# Patient Record
Sex: Male | Born: 1990 | Race: White | Hispanic: No | Marital: Single | Smoking: Never smoker
Health system: Southern US, Community
[De-identification: ages and names within clinical notes are randomized; demographics above are authoritative.]

## PROBLEM LIST (undated history)

## (undated) DIAGNOSIS — E109 Type 1 diabetes mellitus without complications: Secondary | ICD-10-CM

## (undated) DIAGNOSIS — K219 Gastro-esophageal reflux disease without esophagitis: Secondary | ICD-10-CM

## (undated) DIAGNOSIS — R569 Unspecified convulsions: Secondary | ICD-10-CM

## (undated) DIAGNOSIS — I10 Essential (primary) hypertension: Secondary | ICD-10-CM

## (undated) DIAGNOSIS — T148XXA Other injury of unspecified body region, initial encounter: Secondary | ICD-10-CM

## (undated) DIAGNOSIS — S42209A Unspecified fracture of upper end of unspecified humerus, initial encounter for closed fracture: Secondary | ICD-10-CM

## (undated) DIAGNOSIS — E7800 Pure hypercholesterolemia, unspecified: Secondary | ICD-10-CM

## (undated) HISTORY — DX: Pure hypercholesterolemia, unspecified: E78.00

## (undated) HISTORY — DX: Essential (primary) hypertension: I10

## (undated) HISTORY — PX: WISDOM TOOTH EXTRACTION: SHX21

---

## 2012-09-13 ENCOUNTER — Emergency Department (HOSPITAL_COMMUNITY): Payer: BC Managed Care – PPO

## 2012-09-13 ENCOUNTER — Emergency Department (HOSPITAL_COMMUNITY)
Admission: EM | Admit: 2012-09-13 | Discharge: 2012-09-13 | Disposition: A | Discharge status: Home / Self Care | Payer: BC Managed Care – PPO | Attending: Emergency Medicine | Admitting: Emergency Medicine

## 2012-09-13 DIAGNOSIS — E162 Hypoglycemia, unspecified: Secondary | ICD-10-CM

## 2012-09-13 DIAGNOSIS — E1069 Type 1 diabetes mellitus with other specified complication: Secondary | ICD-10-CM | POA: Insufficient documentation

## 2012-09-13 DIAGNOSIS — S42309A Unspecified fracture of shaft of humerus, unspecified arm, initial encounter for closed fracture: Secondary | ICD-10-CM | POA: Insufficient documentation

## 2012-09-13 DIAGNOSIS — Y929 Unspecified place or not applicable: Secondary | ICD-10-CM | POA: Insufficient documentation

## 2012-09-13 DIAGNOSIS — R230 Cyanosis: Secondary | ICD-10-CM | POA: Insufficient documentation

## 2012-09-13 DIAGNOSIS — S42209A Unspecified fracture of upper end of unspecified humerus, initial encounter for closed fracture: Secondary | ICD-10-CM

## 2012-09-13 DIAGNOSIS — R569 Unspecified convulsions: Secondary | ICD-10-CM | POA: Insufficient documentation

## 2012-09-13 DIAGNOSIS — Y939 Activity, unspecified: Secondary | ICD-10-CM | POA: Insufficient documentation

## 2012-09-13 DIAGNOSIS — X58XXXA Exposure to other specified factors, initial encounter: Secondary | ICD-10-CM | POA: Insufficient documentation

## 2012-09-13 HISTORY — DX: Unspecified fracture of upper end of unspecified humerus, initial encounter for closed fracture: S42.209A

## 2012-09-13 LAB — URINALYSIS, ROUTINE W REFLEX MICROSCOPIC
Nitrite: NEGATIVE
Specific Gravity, Urine: 1.018 (ref 1.005–1.030)
Urobilinogen, UA: 0.2 mg/dL (ref 0.0–1.0)
pH: 7.5 (ref 5.0–8.0)

## 2012-09-13 LAB — CBC
Platelets: 376 10*3/uL (ref 150–400)
RBC: 4.67 MIL/uL (ref 4.22–5.81)
WBC: 11.8 10*3/uL — ABNORMAL HIGH (ref 4.0–10.5)

## 2012-09-13 LAB — BASIC METABOLIC PANEL
CO2: 24 mEq/L (ref 19–32)
Chloride: 100 mEq/L (ref 96–112)
Potassium: 4.1 mEq/L (ref 3.5–5.1)
Sodium: 139 mEq/L (ref 135–145)

## 2012-09-13 LAB — GLUCOSE, CAPILLARY: Glucose-Capillary: 114 mg/dL — ABNORMAL HIGH (ref 70–99)

## 2012-09-13 LAB — URINE MICROSCOPIC-ADD ON

## 2012-09-13 MED ORDER — PERCOCET 5-325 MG PO TABS: 1.0000 | ORAL_TABLET | Freq: Four times a day (QID) | ORAL | Status: DC | PRN

## 2012-09-13 MED ORDER — LORAZEPAM 1 MG PO TABS
1.0000 mg | ORAL_TABLET | Freq: Once | ORAL | Status: AC
  Administered 2012-09-13: 1 mg via ORAL
  Filled 2012-09-13: qty 1

## 2012-09-13 MED ORDER — OXYCODONE-ACETAMINOPHEN 5-325 MG PO TABS
1.0000 | ORAL_TABLET | Freq: Once | ORAL | Status: AC
  Administered 2012-09-13: 1 via ORAL
  Filled 2012-09-13 (×2): qty 1

## 2012-09-13 MED ORDER — HYDROCODONE-ACETAMINOPHEN 5-325 MG PO TABS
1.0000 | ORAL_TABLET | Freq: Once | ORAL | Status: AC
  Administered 2012-09-13: 1 via ORAL
  Filled 2012-09-13: qty 1

## 2012-09-13 MED ORDER — HYDROCODONE-ACETAMINOPHEN 5-325 MG PO TABS: 1.0000 | ORAL_TABLET | Freq: Four times a day (QID) | ORAL | Status: DC | PRN

## 2012-09-13 NOTE — ED Provider Notes (Signed)
Medical screening examination/treatment/procedure(s) were performed by non-physician practitioner and as supervising physician I was immediately available for consultation/collaboration.  Gerhard Munch, MD 09/13/12 [phone]

## 2012-09-13 NOTE — ED Notes (Signed)
Patient has given a urinal

## 2012-09-13 NOTE — ED Provider Notes (Addendum)
History     CSN: [id number]  Arrival date & time 09/13/12  1148   First MD Initiated Contact with Patient 09/13/12 1225      Chief Complaint  Patient presents with  . Seizures    (Consider location/radiation/quality/duration/timing/severity/associated sxs/prior treatment) HPI Comments: 21 year old male with a history of type 1 diabetes (no blood insulin pump controlled) resents emergency department with a complaint of hypoglycemic seizure.  Patient states he's had an episode similar to this previously when he was 21 years old.  Episode was witnessed and lasted approximately 30 2/122.  Predominantly left sided shaking, loss of consciousness and cyanosis was present.  No bladder incontinence.  Patient reports that he went out drinking last night and did not eat this morning.  He was at breakfast and felt what his typical symptoms are for hypoglycemia ordering a pancake and orange juice.  Justice patient finished drinking the orange juices when he had the seizure.  He denies current headache, change in vision, ataxia, disequilibrium, head trauma, recent illness, fever, night sweats or chills.  Associated symptoms include muscle fatigue from seizure episode & right shoulder pain.  No other complaints at this time.  The history is provided by the patient.    No past medical history on file.  No past surgical history on file.  No family history on file.  History  Substance Use Topics  . Smoking status: Not on file  . Smokeless tobacco: Not on file  . Alcohol Use: Not on file      Review of Systems  Constitutional: Negative for fever, chills and appetite change.  HENT: Negative for congestion.   Eyes: Negative for visual disturbance.  Respiratory: Negative for shortness of breath.   Cardiovascular: Negative for chest pain and leg swelling.  Gastrointestinal: Negative for abdominal pain.  Genitourinary: Negative for dysuria, urgency and frequency.  Musculoskeletal: Positive for  myalgias.  Neurological: Positive for seizures. Negative for dizziness, syncope, weakness, light-headedness, numbness and headaches.  Psychiatric/Behavioral: Negative for confusion.    Allergies  Review of patient's allergies indicates no known allergies.  Home Medications   Current Outpatient Rx  Name Route Sig Dispense Refill  . IBUPROFEN 200 MG PO TABS Oral Take 200 mg by mouth every 6 (six) hours as needed. pain    . INSULIN ASPART 100 UNIT/ML [place] SOLN Subcutaneous Inject into the skin 3 (three) times daily before meals. Per sliding scale    . LISINOPRIL 5 MG PO TABS Oral Take 5 mg by mouth daily.      BP 154/89  Pulse 117  Temp 97.6 F (36.4 C) (Oral)  Resp 18  SpO2 97%  Physical Exam  Nursing note and vitals reviewed. Constitutional: He appears well-developed and well-nourished. No distress.       Post ictal, mildly altered, Hypertensive   HENT:  Head: Normocephalic.       Atraumatic, No evidence of tongue oral lacerations  Eyes: EOM are normal. Pupils are equal, round, and reactive to light.  Neck: Normal range of motion. Neck supple.       Cervical spinous process non tender without step offs, no difficulty or pain with flexion or extension of neck  Cardiovascular: Normal rate, regular rhythm, normal heart sounds and intact distal pulses.   Pulmonary/Chest: Breath sounds normal. No respiratory distress. He has no wheezes. He has no rales.  Abdominal: Soft. There is no tenderness.  Musculoskeletal: He exhibits tenderness. He exhibits no edema.       TTP over right  upper extremity, inability to perform R shoulder ROM d/t pain.  All other extremities w full active & passive ROM.  Neurological:       CN III-VII intact. Iintact coordination, sensation, and motor (finger grip, biceps, hamstrings & dorsiflexion). No pass pointing, good rapid coordination. Gait normal.   Skin: Skin is warm and dry. He is not diaphoretic.       intact    ED Course  Procedures (including  critical care time)  Labs Reviewed  CBC - Abnormal; Notable for the following:    WBC 11.8 (*)     All other components within normal limits  GLUCOSE, CAPILLARY - Abnormal; Notable for the following:    Glucose-Capillary 114 (*)     All other components within normal limits  URINALYSIS, ROUTINE W REFLEX MICROSCOPIC  BASIC METABOLIC PANEL   No results found.   No diagnosis found.  Consult: Dr. Luiz Blare orthopedics: f-u in his office first thing tomorrow morning (8:30 AM), place in sling and order CT  MDM  Hypoglycemic seizure, Humerus fracture  21 yo T1DM to ER after hypoglycemic seizure that occurred this morning after a night of drinking.  Patient with no evidence of focal neuro deficits on physical exam and is at mental baseline.  Labs and imaging have been reviewed. Humerus fracture seen & ortho consulted as above. Pt advised not to drive until BG is better managed and cleared by PCP. Answered all questions.  Patient is hemodynamically stable and in no acute distress prior to discharge.        Jaci Carrel, [place] 09/13/12 1958

## 2012-09-13 NOTE — ED Notes (Signed)
Pt has family at the bedside. He is A&O x4. Denies any blurred vision, headaches, discomforts. States his arms are a little sore. Will continue to closely monitor. Seizure pads are in place. [place] available.

## 2012-09-13 NOTE — ED Notes (Signed)
ZOX:[id number]<AV> Expected date:09/13/12<BR> Expected time:<BR> Means of arrival:<BR> Comments:<BR> EMS

## 2012-09-13 NOTE — ED Notes (Signed)
Pt brought in via EMS. EMS reports pt was eating breakfast and had a blank stare. He then begin having seizure activity which was witnessed by relatives. Lasted aprox 2-3 min. Pt's eyes rolled, unresponsive and his arms were clinched to his chest. Upon EMS arrival, pt was A&O, slightly lethargic, and states he "sort of felt hypoglycemic activity coming on but didn't drink orange juice" which may have  lead up to the seizure activity. Pt states "he did not check his blood sugar this morning. He wears a insulin pump with a basal rate going at 1.5u/hr. Pt is denying any blurred vision, headaches, discomforts. States his arms are sore however.

## 2012-09-13 NOTE — ED Notes (Signed)
Pt resting comfortably with family at the bedside. States "he is a little more relaxed and his arms do not hurt as much. He states they feel like they are cramping more than anything". Will continue to monitor closely. Denies any discomfort at this time. Pt is aware that a urine spec. Is needed and he is to call if he feels the need to void. Urinal is however at the bedside but pt verbalizes understanding of not getting up. Seizure pads remain in place. Pt is smiling and states "he is comfortable"

## 2012-09-13 NOTE — ED Notes (Addendum)
Pr has completed his shoulder CT. Pain has greatly improved. Parents at the bedside. R shoulder/arm sling is in place and pt verbalizes understanding of how to care for arm and the use of sling.

## 2012-09-14 ENCOUNTER — Other Ambulatory Visit: Payer: Self-pay | Admitting: Orthopedic Surgery

## 2012-09-14 ENCOUNTER — Encounter (HOSPITAL_BASED_OUTPATIENT_CLINIC_OR_DEPARTMENT_OTHER): Payer: Self-pay | Admitting: *Deleted

## 2012-09-14 DIAGNOSIS — T148XXA Other injury of unspecified body region, initial encounter: Secondary | ICD-10-CM

## 2012-09-14 HISTORY — DX: Other injury of unspecified body region, initial encounter: T14.8XXA

## 2012-09-14 NOTE — ED Provider Notes (Signed)
Medical screening examination/treatment/procedure(s) were performed by non-physician practitioner and as supervising physician I was immediately available for consultation/collaboration.   Crawford Tamura, MD 09/14/12 0808 

## 2012-09-15 ENCOUNTER — Ambulatory Visit (HOSPITAL_COMMUNITY): Payer: BC Managed Care – PPO

## 2012-09-15 ENCOUNTER — Ambulatory Visit (HOSPITAL_BASED_OUTPATIENT_CLINIC_OR_DEPARTMENT_OTHER)
Admission: RE | Admit: 2012-09-15 | Discharge: 2012-09-16 | Disposition: A | Discharge status: Home / Self Care | Payer: BC Managed Care – PPO | Source: Ambulatory Visit | Attending: Orthopedic Surgery | Admitting: Orthopedic Surgery

## 2012-09-15 ENCOUNTER — Encounter (HOSPITAL_BASED_OUTPATIENT_CLINIC_OR_DEPARTMENT_OTHER)
Admission: RE | Disposition: A | Discharge status: Home / Self Care | Payer: Self-pay | Source: Ambulatory Visit | Attending: Orthopedic Surgery

## 2012-09-15 ENCOUNTER — Encounter (HOSPITAL_BASED_OUTPATIENT_CLINIC_OR_DEPARTMENT_OTHER): Payer: Self-pay | Admitting: Anesthesiology

## 2012-09-15 ENCOUNTER — Encounter (HOSPITAL_BASED_OUTPATIENT_CLINIC_OR_DEPARTMENT_OTHER): Payer: Self-pay | Admitting: Certified Registered Nurse Anesthetist

## 2012-09-15 ENCOUNTER — Encounter (HOSPITAL_BASED_OUTPATIENT_CLINIC_OR_DEPARTMENT_OTHER): Payer: Self-pay | Admitting: *Deleted

## 2012-09-15 ENCOUNTER — Ambulatory Visit (HOSPITAL_BASED_OUTPATIENT_CLINIC_OR_DEPARTMENT_OTHER): Payer: BC Managed Care – PPO | Admitting: Anesthesiology

## 2012-09-15 DIAGNOSIS — Z9641 Presence of insulin pump (external) (internal): Secondary | ICD-10-CM | POA: Insufficient documentation

## 2012-09-15 DIAGNOSIS — S42213A Unspecified displaced fracture of surgical neck of unspecified humerus, initial encounter for closed fracture: Secondary | ICD-10-CM | POA: Insufficient documentation

## 2012-09-15 DIAGNOSIS — S42209A Unspecified fracture of upper end of unspecified humerus, initial encounter for closed fracture: Secondary | ICD-10-CM

## 2012-09-15 DIAGNOSIS — X500XXA Overexertion from strenuous movement or load, initial encounter: Secondary | ICD-10-CM | POA: Insufficient documentation

## 2012-09-15 DIAGNOSIS — S42293A Other displaced fracture of upper end of unspecified humerus, initial encounter for closed fracture: Secondary | ICD-10-CM | POA: Insufficient documentation

## 2012-09-15 DIAGNOSIS — R569 Unspecified convulsions: Secondary | ICD-10-CM | POA: Insufficient documentation

## 2012-09-15 DIAGNOSIS — E109 Type 1 diabetes mellitus without complications: Secondary | ICD-10-CM

## 2012-09-15 HISTORY — PX: ORIF HUMERUS FRACTURE: SHX2126

## 2012-09-15 HISTORY — DX: Unspecified convulsions: R56.9

## 2012-09-15 HISTORY — DX: Type 1 diabetes mellitus without complications: E10.9

## 2012-09-15 HISTORY — DX: Unspecified fracture of upper end of unspecified humerus, initial encounter for closed fracture: S42.209A

## 2012-09-15 HISTORY — DX: Other injury of unspecified body region, initial encounter: T14.8XXA

## 2012-09-15 HISTORY — DX: Gastro-esophageal reflux disease without esophagitis: K21.9

## 2012-09-15 LAB — POCT I-STAT, CHEM 8
Calcium, Ion: 1.09 mmol/L — ABNORMAL LOW (ref 1.12–1.23)
Glucose, Bld: 201 mg/dL — ABNORMAL HIGH (ref 70–99)
HCT: 39 % (ref 39.0–52.0)
TCO2: 22 mmol/L (ref 0–100)

## 2012-09-15 LAB — GLUCOSE, CAPILLARY: Glucose-Capillary: 220 mg/dL — ABNORMAL HIGH (ref 70–99)

## 2012-09-15 SURGERY — OPEN REDUCTION INTERNAL FIXATION (ORIF) PROXIMAL HUMERUS FRACTURE
Anesthesia: General | Site: Shoulder | Laterality: Right | Wound class: Clean

## 2012-09-15 MED ORDER — POTASSIUM CHLORIDE IN NACL 20-0.45 MEQ/L-% IV SOLN: INTRAVENOUS | Status: DC

## 2012-09-15 MED ORDER — MIDAZOLAM HCL 5 MG/5ML IJ SOLN
INTRAMUSCULAR | Status: DC | PRN
  Administered 2012-09-15: 2 mg via INTRAVENOUS

## 2012-09-15 MED ORDER — LACTATED RINGERS IV SOLN
INTRAVENOUS | Status: DC
  Administered 2012-09-15 (×2): via INTRAVENOUS

## 2012-09-15 MED ORDER — CEFAZOLIN SODIUM-DEXTROSE 2-3 GM-% IV SOLR: 2.0000 g | INTRAVENOUS | Status: DC

## 2012-09-15 MED ORDER — OXYCODONE HCL 5 MG PO TABS: 5.0000 mg | ORAL_TABLET | Freq: Once | ORAL | Status: DC | PRN

## 2012-09-15 MED ORDER — ONDANSETRON HCL 4 MG/2ML IJ SOLN: 4.0000 mg | Freq: Four times a day (QID) | INTRAMUSCULAR | Status: DC | PRN

## 2012-09-15 MED ORDER — LISINOPRIL 5 MG PO TABS: 5.0000 mg | ORAL_TABLET | Freq: Every day | ORAL | Status: DC

## 2012-09-15 MED ORDER — ONDANSETRON HCL 4 MG PO TABS: 4.0000 mg | ORAL_TABLET | Freq: Four times a day (QID) | ORAL | Status: DC | PRN

## 2012-09-15 MED ORDER — OXYCODONE HCL 5 MG/5ML PO SOLN: 5.0000 mg | Freq: Once | ORAL | Status: DC | PRN

## 2012-09-15 MED ORDER — HYDROMORPHONE HCL PF 1 MG/ML IJ SOLN: 0.2500 mg | INTRAMUSCULAR | Status: DC | PRN

## 2012-09-15 MED ORDER — CEFAZOLIN SODIUM-DEXTROSE 2-3 GM-% IV SOLR
2.0000 g | Freq: Four times a day (QID) | INTRAVENOUS | Status: AC
  Administered 2012-09-15 – 2012-09-16 (×3): 2 g via INTRAVENOUS

## 2012-09-15 MED ORDER — ACETAMINOPHEN 650 MG RE SUPP: 650.0000 mg | Freq: Four times a day (QID) | RECTAL | Status: DC | PRN

## 2012-09-15 MED ORDER — 0.9 % SODIUM CHLORIDE (POUR BTL) OPTIME
TOPICAL | Status: DC | PRN
  Administered 2012-09-15: 500 mL

## 2012-09-15 MED ORDER — POVIDONE-IODINE 7.5 % EX SOLN: Freq: Once | CUTANEOUS | Status: DC

## 2012-09-15 MED ORDER — PROPOFOL 10 MG/ML IV BOLUS
INTRAVENOUS | Status: DC | PRN
  Administered 2012-09-15: 200 mg via INTRAVENOUS

## 2012-09-15 MED ORDER — HYDROCODONE-ACETAMINOPHEN 5-325 MG PO TABS
1.0000 | ORAL_TABLET | ORAL | Status: DC | PRN
  Administered 2012-09-15 – 2012-09-16 (×4): 2 via ORAL

## 2012-09-15 MED ORDER — FENTANYL CITRATE 0.05 MG/ML IJ SOLN
INTRAMUSCULAR | Status: DC | PRN
  Administered 2012-09-15: 25 ug via INTRAVENOUS
  Administered 2012-09-15: 50 ug via INTRAVENOUS

## 2012-09-15 MED ORDER — POTASSIUM CHLORIDE IN NACL 20-0.9 MEQ/L-% IV SOLN
INTRAVENOUS | Status: DC
  Administered 2012-09-15: 100 mL/h via INTRAVENOUS

## 2012-09-15 MED ORDER — SUCCINYLCHOLINE CHLORIDE 20 MG/ML IJ SOLN
INTRAMUSCULAR | Status: DC | PRN
  Administered 2012-09-15: 100 mg via INTRAVENOUS

## 2012-09-15 MED ORDER — GLYCOPYRROLATE 0.2 MG/ML IJ SOLN
INTRAMUSCULAR | Status: DC | PRN
  Administered 2012-09-15: .5 mg via INTRAVENOUS

## 2012-09-15 MED ORDER — ALUM & MAG HYDROXIDE-SIMETH 200-200-20 MG/5ML PO SUSP: 30.0000 mL | ORAL | Status: DC | PRN

## 2012-09-15 MED ORDER — FENTANYL CITRATE 0.05 MG/ML IJ SOLN
50.0000 ug | INTRAMUSCULAR | Status: DC | PRN
  Administered 2012-09-15: 100 ug via INTRAVENOUS

## 2012-09-15 MED ORDER — METOCLOPRAMIDE HCL 5 MG PO TABS: 5.0000 mg | ORAL_TABLET | Freq: Three times a day (TID) | ORAL | Status: DC | PRN

## 2012-09-15 MED ORDER — ONDANSETRON HCL 4 MG/2ML IJ SOLN
INTRAMUSCULAR | Status: DC | PRN
  Administered 2012-09-15: 4 mg via INTRAVENOUS

## 2012-09-15 MED ORDER — DOCUSATE SODIUM 100 MG PO CAPS: 100.0000 mg | ORAL_CAPSULE | Freq: Two times a day (BID) | ORAL | Status: DC

## 2012-09-15 MED ORDER — OXYCODONE-ACETAMINOPHEN 5-325 MG PO TABS: 1.0000 | ORAL_TABLET | ORAL | Status: DC | PRN

## 2012-09-15 MED ORDER — METOCLOPRAMIDE HCL 5 MG/ML IJ SOLN: 5.0000 mg | Freq: Three times a day (TID) | INTRAMUSCULAR | Status: DC | PRN

## 2012-09-15 MED ORDER — ROCURONIUM BROMIDE 100 MG/10ML IV SOLN
INTRAVENOUS | Status: DC | PRN
  Administered 2012-09-15: 25 mg via INTRAVENOUS

## 2012-09-15 MED ORDER — LIDOCAINE HCL (CARDIAC) 20 MG/ML IV SOLN
INTRAVENOUS | Status: DC | PRN
  Administered 2012-09-15: 50 mg via INTRAVENOUS

## 2012-09-15 MED ORDER — ONDANSETRON HCL 4 MG/2ML IJ SOLN: 4.0000 mg | Freq: Once | INTRAMUSCULAR | Status: DC | PRN

## 2012-09-15 MED ORDER — ACETAMINOPHEN 325 MG PO TABS: 650.0000 mg | ORAL_TABLET | Freq: Four times a day (QID) | ORAL | Status: DC | PRN

## 2012-09-15 MED ORDER — ZOLPIDEM TARTRATE 5 MG PO TABS: 5.0000 mg | ORAL_TABLET | Freq: Every evening | ORAL | Status: DC | PRN

## 2012-09-15 MED ORDER — CEFAZOLIN SODIUM-DEXTROSE 2-3 GM-% IV SOLR
INTRAVENOUS | Status: DC | PRN
  Administered 2012-09-15: 2 g via INTRAVENOUS

## 2012-09-15 MED ORDER — NEOSTIGMINE METHYLSULFATE 1 MG/ML IJ SOLN
INTRAMUSCULAR | Status: DC | PRN
  Administered 2012-09-15: 3.5 mg via INTRAVENOUS

## 2012-09-15 MED ORDER — MIDAZOLAM HCL 2 MG/2ML IJ SOLN
1.0000 mg | INTRAMUSCULAR | Status: DC | PRN
  Administered 2012-09-15: 2 mg via INTRAVENOUS

## 2012-09-15 MED ORDER — POLYETHYLENE GLYCOL 3350 17 G PO PACK: 17.0000 g | PACK | Freq: Every day | ORAL | Status: DC | PRN

## 2012-09-15 MED ORDER — DIPHENHYDRAMINE HCL 12.5 MG/5ML PO ELIX: 12.5000 mg | ORAL_SOLUTION | ORAL | Status: DC | PRN

## 2012-09-15 MED ORDER — BISACODYL 5 MG PO TBEC: 5.0000 mg | DELAYED_RELEASE_TABLET | Freq: Every day | ORAL | Status: DC | PRN

## 2012-09-15 MED ORDER — MORPHINE SULFATE 2 MG/ML IJ SOLN: 1.0000 mg | INTRAMUSCULAR | Status: DC | PRN

## 2012-09-15 MED ORDER — FLEET ENEMA 7-19 GM/118ML RE ENEM: 1.0000 | ENEMA | Freq: Once | RECTAL | Status: AC | PRN

## 2012-09-15 MED ORDER — MENTHOL 3 MG MT LOZG: 1.0000 | LOZENGE | OROMUCOSAL | Status: DC | PRN

## 2012-09-15 MED ORDER — PHENOL 1.4 % MT LIQD: 1.0000 | OROMUCOSAL | Status: DC | PRN

## 2012-09-15 SURGICAL SUPPLY — 87 items
BENZOIN TINCTURE PRP APPL 2/3 (GAUZE/BANDAGES/DRESSINGS) IMPLANT
BIT DRILL 2.8X4 QC CORT (BIT) ×2 IMPLANT
BIT DRILL 4 LONG FAST STEP (BIT) ×2 IMPLANT
BIT DRILL 4 SHORT FAST STEP (BIT) ×2 IMPLANT
BLADE SURG 15 STRL LF DISP TIS (BLADE) ×2 IMPLANT
BLADE SURG 15 STRL SS (BLADE) ×2
BONE CHIP PRESERV 20CC (Bone Implant) ×2 IMPLANT
CANISTER SUCTION 2500CC (MISCELLANEOUS) ×2 IMPLANT
CHLORAPREP W/TINT 26ML (MISCELLANEOUS) ×2 IMPLANT
CLOTH BEACON ORANGE TIMEOUT ST (SAFETY) ×2 IMPLANT
DRAPE C-ARM 42X72 X-RAY (DRAPES) ×2 IMPLANT
DRAPE INCISE IOBAN 66X45 STRL (DRAPES) ×4 IMPLANT
DRAPE SURG 17X23 STRL (DRAPES) ×2 IMPLANT
DRAPE U 20/CS (DRAPES) ×2 IMPLANT
DRAPE U-SHAPE 47X51 STRL (DRAPES) ×2 IMPLANT
DRAPE U-SHAPE 76X120 STRL (DRAPES) ×4 IMPLANT
DRSG PAD ABDOMINAL 8X10 ST (GAUZE/BANDAGES/DRESSINGS) ×2 IMPLANT
ELECT BLADE 6.5 .24CM SHAFT (ELECTRODE) IMPLANT
ELECT REM PT RETURN 9FT ADLT (ELECTROSURGICAL) ×2
ELECTRODE REM PT RTRN 9FT ADLT (ELECTROSURGICAL) ×1 IMPLANT
GAUZE SPONGE 4X4 16PLY XRAY LF (GAUZE/BANDAGES/DRESSINGS) IMPLANT
GLOVE BIO SURGEON STRL SZ7 (GLOVE) IMPLANT
GLOVE BIO SURGEON STRL SZ7.5 (GLOVE) IMPLANT
GLOVE BIOGEL PI IND STRL 7.0 (GLOVE) ×2 IMPLANT
GLOVE BIOGEL PI IND STRL 8 (GLOVE) ×1 IMPLANT
GLOVE BIOGEL PI INDICATOR 7.0 (GLOVE) ×2
GLOVE BIOGEL PI INDICATOR 8 (GLOVE) ×1
GLOVE SKINSENSE NS SZ6.5 (GLOVE) ×4
GLOVE SKINSENSE NS SZ7.0 (GLOVE) ×1
GLOVE SKINSENSE NS SZ7.5 (GLOVE) ×2
GLOVE SKINSENSE STRL SZ6.5 (GLOVE) ×4 IMPLANT
GLOVE SKINSENSE STRL SZ7.0 (GLOVE) ×1 IMPLANT
GLOVE SKINSENSE STRL SZ7.5 (GLOVE) ×2 IMPLANT
GOWN PREVENTION PLUS XLARGE (GOWN DISPOSABLE) ×10 IMPLANT
IMMOBILIZER SHOULDER XLGE (ORTHOPEDIC SUPPLIES) IMPLANT
NDL SUT 6 .5 CRC .975X.05 MAYO (NEEDLE) ×1 IMPLANT
NEEDLE MAYO TAPER (NEEDLE) ×1
NS IRRIG 1000ML POUR BTL (IV SOLUTION) ×2 IMPLANT
PACK ARTHROSCOPY DSU (CUSTOM PROCEDURE TRAY) ×2 IMPLANT
PACK BASIN DAY SURGERY FS (CUSTOM PROCEDURE TRAY) ×2 IMPLANT
PEG STND 4.0X32.5MM (Orthopedic Implant) ×2 IMPLANT
PEG STND 4.0X35MM (Orthopedic Implant) ×2 IMPLANT
PEG STND 4.0X40MM (Orthopedic Implant) ×2 IMPLANT
PEG STND 4.0X45.0MM (Orthopedic Implant) ×2 IMPLANT
PEG STND 4.0X47.5MM (Orthopedic Implant) ×2 IMPLANT
PEG STND 4.0X50.0MM (Orthopedic Implant) ×2 IMPLANT
PEG THREADED 4.0X45.0MM (Orthopedic Implant) ×2 IMPLANT
PEGSTD 4.0X32.5MM (Orthopedic Implant) ×1 IMPLANT
PEGSTD 4.0X35MM (Orthopedic Implant) ×1 IMPLANT
PEGSTD 4.0X40MM (Orthopedic Implant) ×1 IMPLANT
PEGSTD 4.0X45.0MM (Orthopedic Implant) ×1 IMPLANT
PEGSTD 4.0X47.5MM (Orthopedic Implant) ×1 IMPLANT
PEGSTD 4.0X50.0MM (Orthopedic Implant) ×1 IMPLANT
PENCIL BUTTON HOLSTER BLD 10FT (ELECTRODE) ×2 IMPLANT
PIN GUIDE SHOULDER 2.0MM (PIN) ×4 IMPLANT
PLATE SHOULDER S3 3HOLE RT (Plate) ×2 IMPLANT
SCREW LOCK 90D ANGLED 3.8X26 (Screw) ×2 IMPLANT
SCREW LOCK 90D ANGLED 3.8X28 (Screw) ×2 IMPLANT
SCREW MULTIDIR 3.8X26 HUMRL (Screw) ×2 IMPLANT
SHEET MEDIUM DRAPE 40X70 STRL (DRAPES) IMPLANT
SLEEVE SCD COMPRESS KNEE MED (MISCELLANEOUS) ×2 IMPLANT
SLING ARM FOAM STRAP LRG (SOFTGOODS) IMPLANT
SLING ARM FOAM STRAP MED (SOFTGOODS) IMPLANT
SLING ARM IMMOBILIZER MED (SOFTGOODS) IMPLANT
SPONGE GAUZE 4X4 12PLY (GAUZE/BANDAGES/DRESSINGS) ×2 IMPLANT
SPONGE LAP 18X18 X RAY DECT (DISPOSABLE) ×2 IMPLANT
SPONGE LAP 4X18 X RAY DECT (DISPOSABLE) ×2 IMPLANT
STRIP CLOSURE SKIN 1/2X4 (GAUZE/BANDAGES/DRESSINGS) ×2 IMPLANT
SUCTION FRAZIER TIP 10 FR DISP (SUCTIONS) ×2 IMPLANT
SUPPORT WRAP ARM LG (MISCELLANEOUS) ×2 IMPLANT
SUT ETHILON 4 0 PS 2 18 (SUTURE) IMPLANT
SUT FIBERWIRE #2 38 T-5 BLUE (SUTURE) ×8
SUT MNCRL AB 3-0 PS2 18 (SUTURE) ×2 IMPLANT
SUT MNCRL AB 4-0 PS2 18 (SUTURE) ×2 IMPLANT
SUT PDS AB 0 CT 36 (SUTURE) IMPLANT
SUT PROLENE 3 0 PS 2 (SUTURE) IMPLANT
SUT VIC AB 0 CT1 18XCR BRD 8 (SUTURE) IMPLANT
SUT VIC AB 0 CT1 8-18 (SUTURE)
SUT VIC AB 2-0 CT1 27 (SUTURE) ×1
SUT VIC AB 2-0 CT1 TAPERPNT 27 (SUTURE) ×1 IMPLANT
SUT VIC AB 2-0 SH 18 (SUTURE) IMPLANT
SUTURE FIBERWR #2 38 T-5 BLUE (SUTURE) ×4 IMPLANT
SYR BULB 3OZ (MISCELLANEOUS) ×2 IMPLANT
TOWEL OR 17X24 6PK STRL BLUE (TOWEL DISPOSABLE) ×2 IMPLANT
TOWEL OR NON WOVEN STRL DISP B (DISPOSABLE) ×2 IMPLANT
WATER STERILE IRR 1000ML POUR (IV SOLUTION) IMPLANT
YANKAUER SUCT BULB TIP NO VENT (SUCTIONS) ×2 IMPLANT

## 2012-09-15 NOTE — Anesthesia Procedure Notes (Addendum)
Procedure Name: Intubation Date/Time: 09/15/2012 1:33 PM Performed by: Zenia Resides D Pre-anesthesia Checklist: Patient identified, Emergency Drugs available, Suction available, Patient being monitored and Timeout performed Patient Re-evaluated:Patient Re-evaluated prior to inductionOxygen Delivery Method: Circle System Utilized Preoxygenation: Pre-oxygenation with 100% oxygen Intubation Type: IV induction Ventilation: Mask ventilation without difficulty Laryngoscope Size: Miller and 2 Grade View: Grade I Tube type: Oral Number of attempts: 1 Airway Equipment and Method: stylet and oral airway Placement Confirmation: ETT inserted through vocal cords under direct vision,  positive ETCO2 and breath sounds checked- equal and bilateral Secured at: 22 cm Tube secured with: Tape Dental Injury: Teeth and Oropharynx as per pre-operative assessment    Anesthesia Regional Block:  Interscalene brachial plexus block  Pre-Anesthetic Checklist: ,, timeout performed, Correct Patient, Correct Site, Correct Laterality, Correct Procedure, Correct Position, site marked, Risks and benefits discussed,  Surgical consent,  Pre-op evaluation,  At surgeon's request and post-op pain management  Laterality: Right and Upper  Prep: chloraprep       Needles:  Injection technique: Single-shot  Needle Type: Echogenic Needle     Needle Length: 5cm 5 cm Needle Gauge: 21 and 21 G    Additional Needles:  Procedures: ultrasound guided (picture in chart) Interscalene brachial plexus block Narrative:  Start time: 09/23/2012 1:12 PM End time: 09/23/2012 1:20 PM Injection made incrementally with aspirations every 5 mL.  Performed by: Personally  Anesthesiologist: Sheldon Silvan  Supraclavicular block

## 2012-09-15 NOTE — Op Note (Signed)
Procedure(s): OPEN REDUCTION INTERNAL FIXATION (ORIF) PROXIMAL HUMERUS FRACTURE SHOULDER HEMI-ARTHROPLASTY Procedure Note  Anthony Warner male 21 y.o. 09/15/2012  Procedure(s) and Anesthesia Type:    * OPEN REDUCTION INTERNAL FIXATION (ORIF) right 3 part PROXIMAL HUMERUS FRACTURE - General    Surgeon(s) and Role:    * Mable Paris, MD - Primary   Indications:  21 y.o. male s/p seizure 2 days ago with right 3 part proximal humerus fracture. Indicated for surgery to promote anatomic restoration anatomy, improve functional outcome and restore the articular surface.    Surgeon: Mable Paris   Assistants: Damita Lack PA-C [facility] was present and scrubbed throughout the procedure and was essential in positioning, retraction, exposure, and closure)  Anesthesia: General endotracheal anesthesia with preoperative interscalene block    Procedure Detail  Findings: He had a 3 part proximal humerus fracture with significant articular impaction of the anterior superior humeral head. The head was disimpacted through the lesser tuberosity fracture and the head was reduced to the shaft in an acceptable position with 20 cc of cancellus bone graft buttressing the fracture. The lesser tuberosity was one large fragment including the biceps groove. The fracture was ultimately fixed with a combination of #2 FiberWire for augmentation through the rotator cuff and a Biomet S3 locking plate with 3 distal shaft screws and 7 pegs, one terminally threaded anteriorly.  Estimated Blood Loss:  200 mL         Drains: none  Blood Given: none         Specimens: none        Complications:  * No complications entered in OR log *         Disposition: PACU - hemodynamically stable.         Condition: stable    Procedure:  DESCRIPTION OF PROCEDURE: The patient was identified in preoperative  holding area where I personally marked the operative site after  verifying site,  side, and procedure with the patient. He had an interscalene block given by the attending anesthesiologist. The patient was taken back  to the operating room where general anesthesia was induced without  complication and was placed in the beach-chair position with the back  elevated about 40 degrees and all extremities carefully padded and  positioned. The right upper extremity was prepped and draped in standard sterile fashion. The patient did receive preoperative IV antibiotics. The appropriate timeout procedure was carried out. An approximately 12 cm incision made from the coracoid tip to the Center point of the humerus at the level of the axilla. Dissection was carried down through subcutaneous tissues to the level of the cephalic vein identifying the deltopectoral interval. The cephalic vein was taken laterally with the deltoid. The pectoralis major was retracted medially. The underlying conjoined tendon was identified and ultimately a retractor was placed underneath the conjoined medially. The biceps tendon was identified in the transverse ligament was separated to use those the biceps tendon. The rotator interval was opened. The biceps tendon was then tenodesed to the upper border of the pectoralis major and the remaining portion of the biceps proximally was excised. The fracture lines were then carefully exposed and the lesser tuberosity fracture was elevated anteriorly. The subscapularis was tagged with 2 #2 fiber wires. Once the lesser tuberosity and subscapularis were retracted anteriorly the humeral head fracture was identified with the depressed anterior segment. I was able to elevate the depressed segment anatomically to match the articular surface using a Cobb elevator behind the segment. 2  more #2 FiberWire is were then placed in the posterior rotator cuff to control the greater tuberosity and head fragment. These were pulled anteriorly to reduce the head and a Cobb elevator was again used to try  and disimpact the head from the shaft. This left a void in the central head which was then filled with 20 cc of cancellus allograft chips. The lesser tuberosity was then reduced anteriorly and pinned in place. X-rays at this point verified that the reduction was acceptable. A plate was placed just lateral to the bicipital groove and a central guidepin was placed. Position was verified on x-ray. Distally one shaft screw was drilled measured and filled bringing the plate down to the bone. Proximally the locking pegs were then each drilled measured and filled with the appropriate size smooth pegs. The anterior-inferior PEG would not locking to the plate and therefore was switched out for a partially threaded PEG which was successfully locked into the plate. The distal shaft screws were then all drilled measured and filled with the appropriate sized bicortical screws with good purchase. Proximally the sutures in the posterior, superior rotator cuff, and subscapularis were tied in the plate to reinforce the repair. Final fluoroscopic imaging in all planes demonstrated acceptable reduction of the fracture with slight residual impaction medially. Guide pins were removed. The wound is copiously irrigated with normal saline. It was then closed in layers with 2-0 Vicryl in a deep dermal layer and 4-0 Monocryl for skin closure. A light sterile dressing was applied. The patient was placed in a sling, allowed to awaken from general anesthesia, transferred to stretcher and taken to the recovery room in stable condition.  POSTOPERATIVE PLAN: He will be kept overnight for pain control and  antibiotics, and will likely be discharged in the morning in a sling.  He will remain in the sling for about 4 weeks postoperatively. He will have some progressive passive motion as healing occurs.

## 2012-09-15 NOTE — Anesthesia Postprocedure Evaluation (Signed)
  Anesthesia Post-op Note  Patient: Anthony Warner  Procedure(s) Performed: Procedure(s) (LRB) with comments: OPEN REDUCTION INTERNAL FIXATION (ORIF) PROXIMAL HUMERUS FRACTURE (Right)  Patient Location: PACU  Anesthesia Type:GA combined with regional for post-op pain  Level of Consciousness: awake, alert  and oriented  Airway and Oxygen Therapy: Patient Spontanous Breathing and Patient connected to face mask oxygen  Post-op Pain: mild  Post-op Assessment: Post-op Vital signs reviewed  Post-op Vital Signs: Reviewed  Complications: No apparent anesthesia complications

## 2012-09-15 NOTE — Transfer of Care (Signed)
Immediate Anesthesia Transfer of Care Note  Patient: Anthony Warner  Procedure(s) Performed: Procedure(s) (LRB) with comments: OPEN REDUCTION INTERNAL FIXATION (ORIF) PROXIMAL HUMERUS FRACTURE (Right) SHOULDER HEMI-ARTHROPLASTY (Right) - VERSES HEMI ATHROPLASTY RIGHT SHOULDER   Patient Location: PACU  Anesthesia Type:General and Regional  Level of Consciousness: awake, alert , oriented and patient cooperative  Airway & Oxygen Therapy: Patient Spontanous Breathing and Patient connected to face mask oxygen  Post-op Assessment: Report given to PACU RN and Post -op Vital signs reviewed and stable  Post vital signs: Reviewed and stable  Complications: No apparent anesthesia complications

## 2012-09-15 NOTE — Progress Notes (Signed)
Assisted Dr. Crews with right, ultrasound guided, interscalene  block. Side rails up, monitors on throughout procedure. See vital signs in flow sheet. Tolerated Procedure well. 

## 2012-09-15 NOTE — Anesthesia Preprocedure Evaluation (Signed)
Anesthesia Evaluation  Patient identified by MRN, date of birth, ID band Patient awake    Reviewed: Allergy & Precautions, H&P , NPO status , Patient's Chart, lab work & pertinent test results  Airway Mallampati: I TM Distance: >3 FB Neck ROM: Full    Dental  (+) Teeth Intact and Dental Advisory Given   Pulmonary          Cardiovascular Rhythm:Regular Rate:Normal     Neuro/Psych    GI/Hepatic GERD-  Medicated and Controlled,  Endo/Other  diabetes  Renal/GU      Musculoskeletal   Abdominal   Peds  Hematology   Anesthesia Other Findings   Reproductive/Obstetrics                           Anesthesia Physical Anesthesia Plan  ASA: II  Anesthesia Plan: General   Post-op Pain Management:    Induction: Intravenous  Airway Management Planned: Oral ETT  Additional Equipment:   Intra-op Plan:   Post-operative Plan:   Informed Consent: I have reviewed the patients History and Physical, chart, labs and discussed the procedure including the risks, benefits and alternatives for the proposed anesthesia with the patient or authorized representative who has indicated his/her understanding and acceptance.   Dental advisory given  Plan Discussed with: CRNA, Anesthesiologist and Surgeon  Anesthesia Plan Comments:         Anesthesia Quick Evaluation

## 2012-09-15 NOTE — H&P (Signed)
Anthony Warner is an 21 y.o. male.   Chief Complaint: R shoulder injury after seizure HPI: S/p seizure 2 days ago with severe R shoulder fracture.  Past Medical History  Diagnosis Date  . Seizures     x 2 - due to hypoglycemia  . Diabetes mellitus type 1     Insulin pump  . Abrasion 09/14/2012    left lower leg  . Proximal humerus fracture 09/13/2012    right; caused by hypoglycemic seizure/muscle contraction  . GERD (gastroesophageal reflux disease)     tums prn    Past Surgical History  Procedure Date  . Wisdom tooth extraction     History reviewed. No pertinent family history. Social History:  reports that he has never smoked. He has never used smokeless tobacco. He reports that he drinks alcohol. He reports that he does not use illicit drugs.  Allergies:  Allergies  Allergen Reactions  . Latex Other (See Comments)    SKIN IRRITATION    Medications Prior to Admission  Medication Sig Dispense Refill  . ibuprofen (ADVIL,MOTRIN) 200 MG tablet Take 200 mg by mouth every 6 (six) hours as needed. pain      . insulin aspart (NOVOLOG) 100 UNIT/ML injection Inject into the skin 3 (three) times daily before meals. Insulin pump      . lisinopril (PRINIVIL,ZESTRIL) 5 MG tablet Take 5 mg by mouth daily.      Marland Kitchen PERCOCET 5-325 MG per tablet Take 1 tablet by mouth every 6 (six) hours as needed for pain.  30 tablet  0    Results for orders placed during the hospital encounter of 09/15/12 (from the past 48 hour(s))  POCT I-STAT, CHEM 8     Status: Abnormal   Collection Time   09/15/12 12:15 PM      Component Value Range Comment   Sodium 138  135 - 145 mEq/L    Potassium 4.0  3.5 - 5.1 mEq/L    Chloride 102  96 - 112 mEq/L    BUN 15  6 - 23 mg/dL    Creatinine, Ser 1.61 (*) 0.50 - 1.35 mg/dL    Glucose, Bld 096 (*) 70 - 99 mg/dL    Calcium, Ion 0.45 (*) 1.12 - 1.23 mmol/L    TCO2 22  0 - 100 mmol/L    Hemoglobin 13.3  13.0 - 17.0 g/dL    HCT 40.9  81.1 - 91.4 %    Dg  Shoulder Right  09/13/2012  *RADIOLOGY REPORT*  Clinical Data: Right shoulder pain status post fall.  RIGHT SHOULDER - 2+ VIEW  Comparison: None.  Findings: There is an impacted and mildly displaced fracture of the right humeral neck.  This demonstrates anterior displacement on the lateral view.  There is possible involvement of the humeral head articular surface.  No dislocation is seen.  IMPRESSION: Impacted and displaced fracture of the right humeral neck as described.  There is possible involvement of the humeral head articular surface.   Original Report Authenticated By: Gerrianne Scale, M.D.    Ct Shoulder Right Wo Contrast  09/13/2012  *RADIOLOGY REPORT*  Clinical Data: Seizure.  Proximal right humeral fracture.  CT OF THE RIGHT SHOULDER WITHOUT CONTRAST  Technique:  Multidetector CT imaging was performed according to the standard protocol. Multiplanar CT image reconstructions were also generated.  Comparison: Radiographs same day.  Findings: Comminuted fracture of the right humeral neck involves both tuberosities and the articular surface of the humeral head anteriorly. The humeral  head articular surface is rotated posteriorly with respect to the glenoid.  There is no dislocation. The fracture is moderately impacted.  No intra-articular fracture fragments are seen.  There is no evidence of scapular or clavicle fracture.  No rib fractures are identified.  The visualized right lung is clear.  IMPRESSION: Comminuted fracture of the right humeral neck involves the tuberosities and anterior aspect of the humeral head articular surface.  No glenoid fracture or dislocation.   Original Report Authenticated By: Gerrianne Scale, M.D.     Review of Systems  Neurological: Positive for seizures.  All other systems reviewed and are negative.    Blood pressure 167/119, pulse 107, temperature 97.9 F (36.6 C), temperature source Oral, resp. rate 18, height 5\' 8"  (1.727 m), weight 84.913 kg (187 lb 3.2  oz), SpO2 96.00%. Physical Exam  Constitutional: He is oriented to person, place, and time. He appears well-developed and well-nourished.  HENT:  Head: Atraumatic.  Eyes: EOM are normal.  Cardiovascular: Intact distal pulses.   Respiratory: Effort normal.  Musculoskeletal:       Right shoulder: He exhibits decreased range of motion, tenderness, swelling and pain.  Neurological: He is alert and oriented to person, place, and time.  Skin: Skin is warm and dry.  Psychiatric: He has a normal mood and affect.     Assessment/Plan R shoulder proximal humerus fracture with head impaction Plan ORIF, unlikely to need hemiarthoplasty Risks / benefits of surgery discussed Consent on chart  NPO for OR Preop antibiotics Plan 1 night stay.  Mable Paris 09/15/2012, 1:02 PM

## 2012-09-16 DIAGNOSIS — E109 Type 1 diabetes mellitus without complications: Secondary | ICD-10-CM

## 2012-09-16 DIAGNOSIS — S42209A Unspecified fracture of upper end of unspecified humerus, initial encounter for closed fracture: Secondary | ICD-10-CM

## 2012-09-16 MED ORDER — HYDROCODONE-ACETAMINOPHEN 5-325 MG PO TABS: ORAL_TABLET | ORAL | Status: DC

## 2012-09-16 NOTE — Discharge Summary (Signed)
Patient ID: Majed Pellegrin MRN: [id number] DOB/AGE: Jan 02, 1991 21 y.o.  Admit date: 09/15/2012 Discharge date: 09/16/2012  Admission Diagnoses:  Active Problems:  Type 1 diabetes mellitus proximal humerus fracture  Discharge Diagnoses:  Same  Past Medical History  Diagnosis Date  . Seizures     x 2 - due to hypoglycemia  . Diabetes mellitus type 1     Insulin pump  . Abrasion 09/14/2012    left lower leg  . Proximal humerus fracture 09/13/2012    right; caused by hypoglycemic seizure/muscle contraction  . GERD (gastroesophageal reflux disease)     tums prn    Surgeries: Procedure(s): OPEN REDUCTION INTERNAL FIXATION (ORIF) PROXIMAL HUMERUS FRACTURE on 09/15/2012   Consultants:    Discharged Condition: Improved  Hospital Course: Nichalos Brenton is an 21 y.o. male who was admitted 09/15/2012 for operative treatment ofFracture, humerus, proximal. Patient sustained a proximal humerus fracture after seizure and was determined to need operative management. After pre-op clearance the patient was taken to the operating room on 09/15/2012 and underwent  Procedure(s): OPEN REDUCTION INTERNAL FIXATION (ORIF) PROXIMAL HUMERUS FRACTURE.    Patient was given perioperative antibiotics: Anti-infectives     Start     Dose/Rate Route Frequency Ordered Stop   09/15/12 1830   ceFAZolin (ANCEF) IVPB 2 g/50 mL premix        2 g 100 mL/hr over 30 Minutes Intravenous Every 6 hours 09/15/12 1824 09/16/12 0703   09/15/12 1734   ceFAZolin (ANCEF) IVPB 2 g/50 mL premix  Status:  Discontinued        2 g 100 mL/hr over 30 Minutes Intravenous 60 min pre-op 09/15/12 1734 09/15/12 1825           Patient was given sequential compression devices and early ambulation to prevent DVTs.  Patient benefited maximally from hospital stay and there were no complications.    Recent vital signs: Patient Vitals for the past 24 hrs:  BP Temp Temp src Pulse Resp SpO2 Weight  09/16/12 0315 137/83 mmHg  98.5 F (36.9 C) - 98  16  98 % -  09/16/12 0000 - - - - - 96 % -  09/15/12 2300 - - - - - 97 % -  09/15/12 2200 - - - - - 97 % -  09/15/12 2059 147/90 mmHg 98.3 F (36.8 C) - 90  16  98 % -  09/15/12 2000 153/84 mmHg - - 110  16  94 % -  09/15/12 1900 157/92 mmHg - - 99  20  100 % -  09/15/12 1815 145/92 mmHg 98.2 F (36.8 C) - 98  18  99 % -  09/15/12 1800 140/79 mmHg - - 104  12  96 % -  09/15/12 1755 - - - - - 93 % -  09/15/12 1745 142/79 mmHg - - 107  13  98 % -  09/15/12 1730 143/86 mmHg - - 110  12  96 % -  09/15/12 1715 145/93 mmHg - - 111  12  95 % -  09/15/12 1700 143/94 mmHg 99.5 F (37.5 C) - 115  14  95 % -  09/15/12 1321 - - - 120  18  100 % -  09/15/12 1320 - - - 118  18  100 % -  09/15/12 1319 - - - 116  18  100 % -  09/15/12 1318 - - - 113  16  100 % -  09/15/12 1317 - - - 109  15  100 % -  09/15/12 1316 - - - 111  19  100 % -  09/15/12 1315 144/99 mmHg - - 106  18  100 % -  09/15/12 1314 - - - 100  14  100 % -  09/15/12 1313 - - - 98  14  100 % -  09/15/12 1312 - - - 96  14  100 % -  09/15/12 1311 - - - 98  14  100 % -  09/15/12 1310 - - - 97  14  100 % -  09/15/12 1309 - - - 97  12  100 % -  09/15/12 1308 - - - 94  14  100 % -  09/15/12 1307 - - - 98  13  100 % -  09/15/12 1306 143/94 mmHg - - 95  10  99 % -  09/15/12 1305 - - - 93  13  97 % -  09/15/12 1304 - - - 95  15  100 % -  09/15/12 1303 - - - 95  10  100 % -  09/15/12 1302 - - - 105  15  97 % -  09/15/12 1301 - - - 105  20  97 % -  09/15/12 1300 156/100 mmHg - - 106  20  98 % -  09/15/12 1259 - - - 105  17  97 % -  09/15/12 1258 - - - 101  16  97 % -  09/15/12 1143 167/119 mmHg 97.9 F (36.6 C) Oral 107  18  96 % 84.913 kg (187 lb 3.2 oz)     Recent laboratory studies:  Basename 09/15/12 1215 09/28/2012 1310  WBC -- 11.8*  HGB 13.3 14.2  HCT 39.0 40.8  PLT -- 376  NA 138 139  K 4.0 4.1  CL 102 100  CO2 -- 24  BUN 15 11  CREATININE 1.40* 0.86  GLUCOSE 201* 101*  INR -- --  CALCIUM --  9.4     Discharge Medications:     Medication List     As of 09/16/2012  8:09 AM    ASK your doctor about these medications         ibuprofen 200 MG tablet   Commonly known as: ADVIL,MOTRIN   Take 200 mg by mouth every 6 (six) hours as needed. pain      insulin aspart 100 UNIT/ML injection   Commonly known as: novoLOG   Inject into the skin 3 (three) times daily before meals. Insulin pump      lisinopril 5 MG tablet   Commonly known as: PRINIVIL,ZESTRIL   Take 5 mg by mouth daily.      PERCOCET 5-325 MG per tablet   Generic drug: oxyCODONE-acetaminophen   Take 1 tablet by mouth every 6 (six) hours as needed for pain.        Diagnostic Studies: Dg Shoulder Right  09/15/2012  *RADIOLOGY REPORT*  Clinical Data: ORIF humeral head fracture  DG C-ARM GT 120 MIN,RIGHT SHOULDER - 2+ VIEW  Technique:  Comparison:  09-28-12  Findings: The humeral neck fracture has been fixed with a plate and screws.  Alignment appears satisfactory.  Mild inferior subluxation of the humerus relative to the  glenoid fossa.  IMPRESSION: Satisfactory fracture fixation.   Original Report Authenticated By: Camelia Phenes, M.D.    Dg Shoulder Right  2012-09-28  *RADIOLOGY REPORT*  Clinical Data: Right shoulder pain status post fall.  RIGHT SHOULDER - 2+ VIEW  Comparison: None.  Findings: There is an impacted and mildly displaced fracture of the right humeral neck.  This demonstrates anterior displacement on the lateral view.  There is possible involvement of the humeral head articular surface.  No dislocation is seen.  IMPRESSION: Impacted and displaced fracture of the right humeral neck as described.  There is possible involvement of the humeral head articular surface.   Original Report Authenticated By: Gerrianne Scale, M.D.    Ct Shoulder Right Wo Contrast  09/13/2012  *RADIOLOGY REPORT*  Clinical Data: Seizure.  Proximal right humeral fracture.  CT OF THE RIGHT SHOULDER WITHOUT CONTRAST  Technique:   Multidetector CT imaging was performed according to the standard protocol. Multiplanar CT image reconstructions were also generated.  Comparison: Radiographs same day.  Findings: Comminuted fracture of the right humeral neck involves both tuberosities and the articular surface of the humeral head anteriorly. The humeral head articular surface is rotated posteriorly with respect to the glenoid.  There is no dislocation. The fracture is moderately impacted.  No intra-articular fracture fragments are seen.  There is no evidence of scapular or clavicle fracture.  No rib fractures are identified.  The visualized right lung is clear.  IMPRESSION: Comminuted fracture of the right humeral neck involves the tuberosities and anterior aspect of the humeral head articular surface.  No glenoid fracture or dislocation.   Original Report Authenticated By: Gerrianne Scale, M.D.    Dg C-arm Gt 120 Min  09/15/2012  *RADIOLOGY REPORT*  Clinical Data: ORIF humeral head fracture  DG C-ARM GT 120 MIN,RIGHT SHOULDER - 2+ VIEW  Technique:  Comparison:  09/13/2012  Findings: The humeral neck fracture has been fixed with a plate and screws.  Alignment appears satisfactory.  Mild inferior subluxation of the humerus relative to the  glenoid fossa.  IMPRESSION: Satisfactory fracture fixation.   Original Report Authenticated By: Camelia Phenes, M.D.     Disposition: 01-Home or Self Care        Follow-up Information    Follow up with Mable Paris, MD. Schedule an appointment as soon as possible for a visit on 09/28/2012.   Contact information:   [facility] [address] [place] [address] [phone]           Signed: Jiles Harold 09/16/2012, 8:09 AM

## 2012-09-16 NOTE — Progress Notes (Signed)
PATIENT ID: Anthony Warner   1 Day Post-Op Procedure(s) (LRB): OPEN REDUCTION INTERNAL FIXATION (ORIF) PROXIMAL HUMERUS FRACTURE (Right)  Subjective: Feeling well. Slept well throughout the night. Pain is well controlled. Ready to go home.  Objective:  Filed Vitals:   09/16/12 0315  BP: 137/83  Pulse: 98  Temp: 98.5 F (36.9 C)  Resp: 16     R UE dressing clean, dry, intact Wiggles fingers, intact sensation to light touch  Labs:   Basename 09/15/12 1215 09/13/12 1310  HGB 13.3 14.2   Basename 09/15/12 1215 09/13/12 1310  WBC -- 11.8*  RBC -- 4.67  HCT 39.0 40.8  PLT -- 376   Basename 09/15/12 1215 09/13/12 1310  NA 138 139  K 4.0 4.1  CL 102 100  CO2 -- 24  BUN 15 11  CREATININE 1.40* 0.86  GLUCOSE 201* 101*  CALCIUM -- 9.4    Assessment and Plan: 1 day post op ORIF Prox humerus Fracture Feeling very well, pain well controlled D/c home today  Norco script for pain control, does not tolerate Percocet well F/u with Dr. Ave Filter in 10 days  VTE proph: SCDs

## 2012-09-17 ENCOUNTER — Encounter (HOSPITAL_BASED_OUTPATIENT_CLINIC_OR_DEPARTMENT_OTHER): Payer: Self-pay | Admitting: Orthopedic Surgery

## 2012-09-18 ENCOUNTER — Encounter (HOSPITAL_BASED_OUTPATIENT_CLINIC_OR_DEPARTMENT_OTHER): Payer: Self-pay

## 2012-09-23 MED ORDER — BUPIVACAINE-EPINEPHRINE PF 0.5-1:200000 % IJ SOLN
INTRAMUSCULAR | Status: DC | PRN
  Administered 2012-09-15: 25 mL

## 2012-09-23 NOTE — Addendum Note (Signed)
Addendum  created 09/23/12 1505 by Willodene Stallings A Brailen Macneal, MD   Modules edited:Anesthesia Blocks and Procedures, Anesthesia Medication Administration, Inpatient Notes    

## 2012-09-23 NOTE — Addendum Note (Signed)
Addendum  created 09/23/12 1505 by Kerby Nora, MD   Modules edited:Anesthesia Blocks and Procedures, Anesthesia Medication Administration, Inpatient Notes

## 2013-04-19 IMAGING — CT CT SHOULDER*R* W/O CM
4 series · 17 of 33 positions shown, 19 images · non-contrast
Comparison: Radiographs same day.

CLINICAL DATA: Seizure.  Proximal right humeral fracture.

CT OF THE RIGHT SHOULDER WITHOUT CONTRAST
TECHNIQUE: Multidetector CT imaging was performed according to the
standard protocol. Multiplanar CT image reconstructions were also
generated.

[Series 6: shoulder/sc jts 3.0 b30s · axial · 0.50mm/px · z∈[+1233,+1317]mm · 3 of 57 slices shown, 4 images]
[im 15/57  soft-tissue]
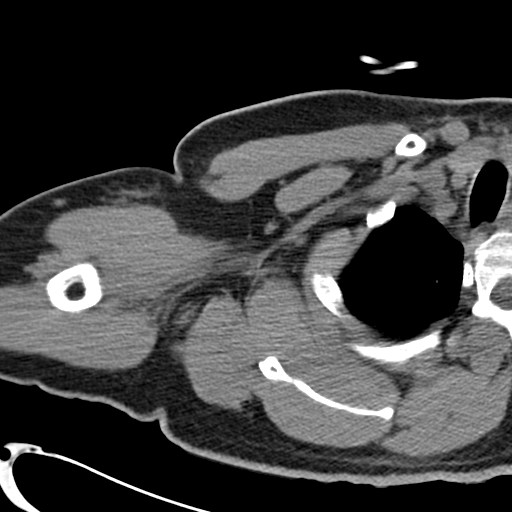
[im 15/57  bone]
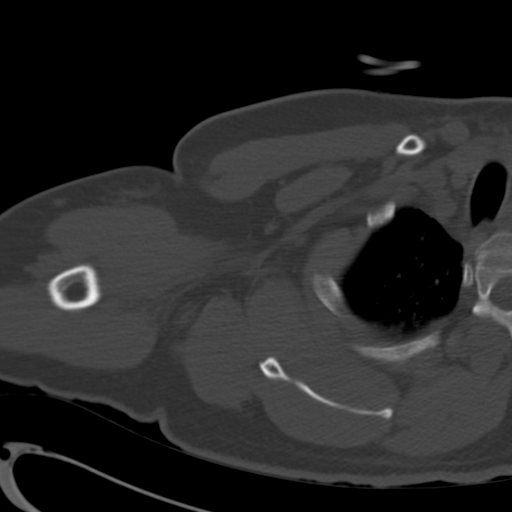
[im 29/57  bone]
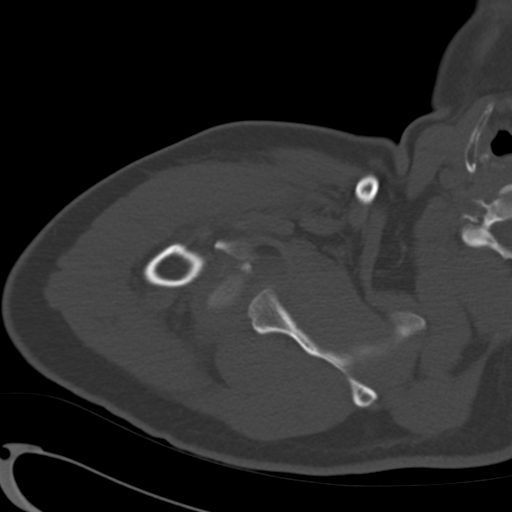
[im 43/57  bone]
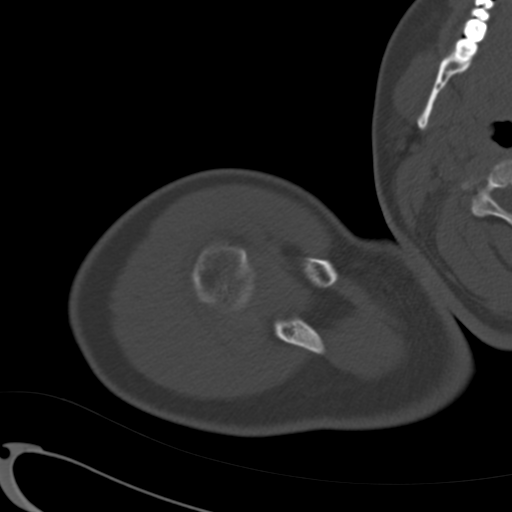

[Series 602: coronal · coronal · 0.50mm/px · 3 of 89 slices shown]
[im 18/89  bone]
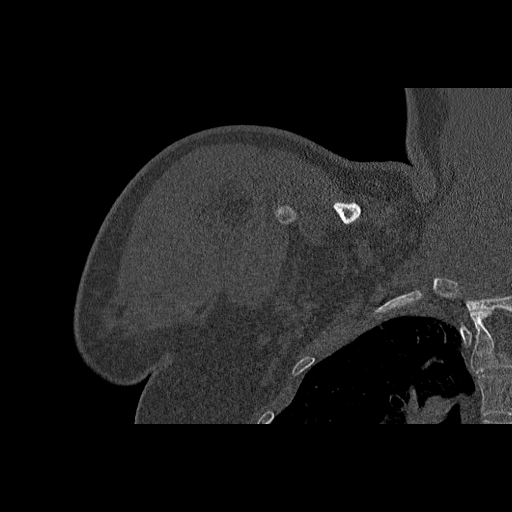
[im 36/89  bone]
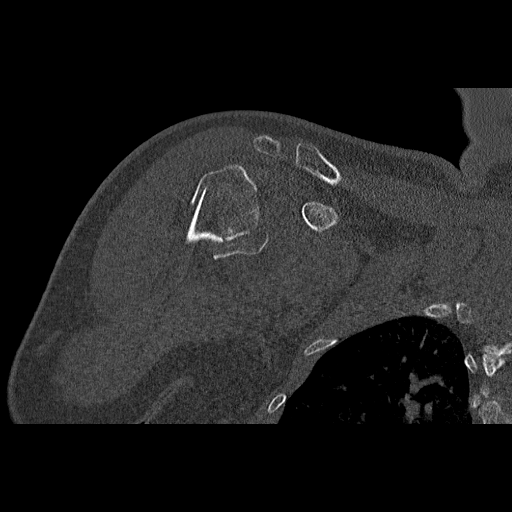
[im 53/89  bone]
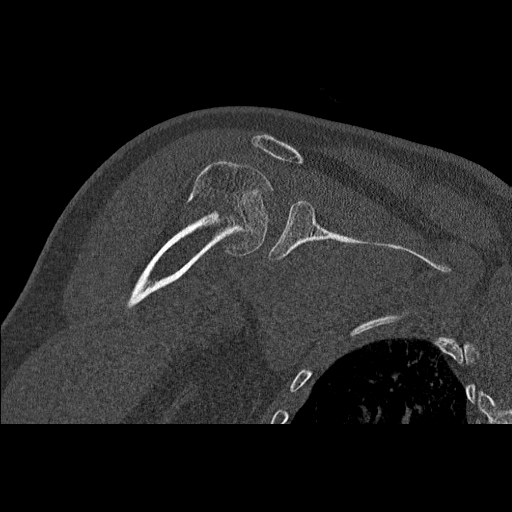

[Series 603: axial · axial · 0.50mm/px · z∈[+1318,+1369]mm · 6 of 89 slices shown]
[im 12/89  bone]
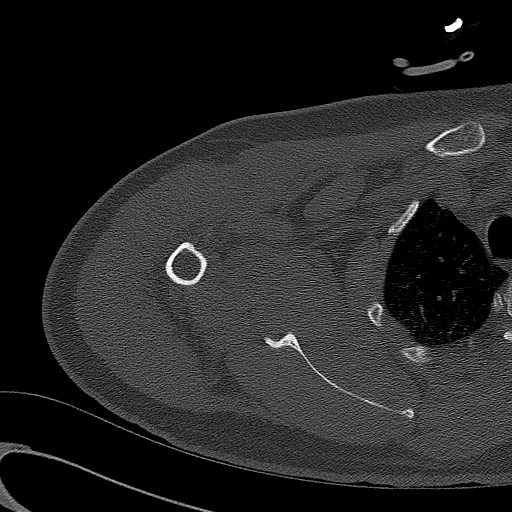
[im 23/89  bone]
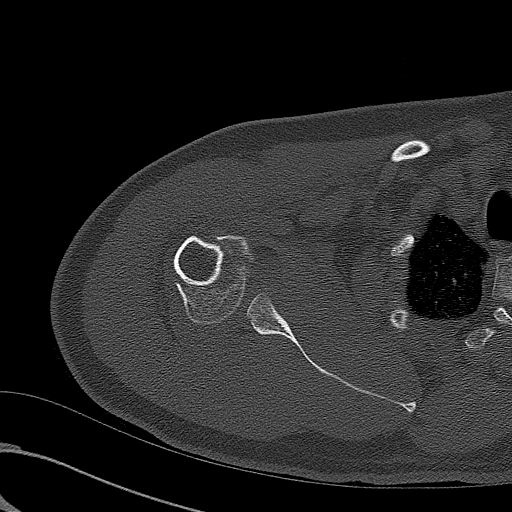
[im 34/89  bone]
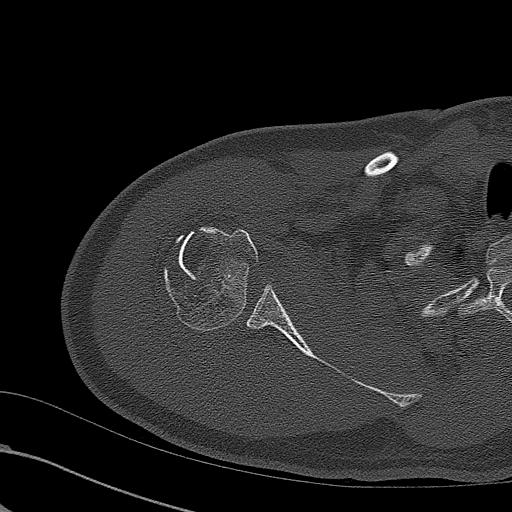
[im 45/89  bone]
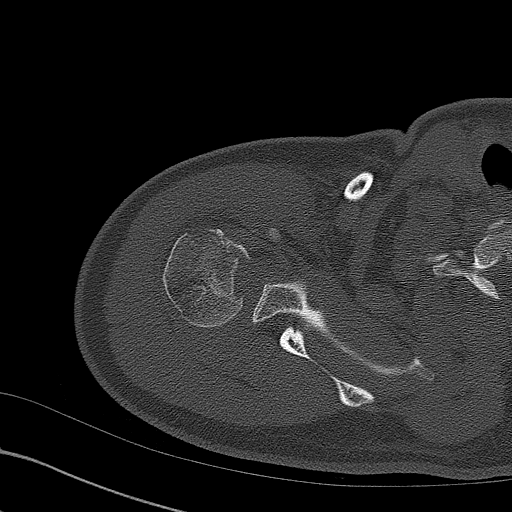
[im 56/89  bone]
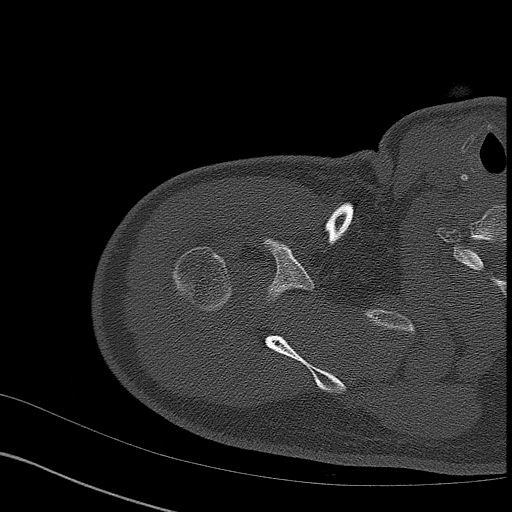
[im 67/89  bone]
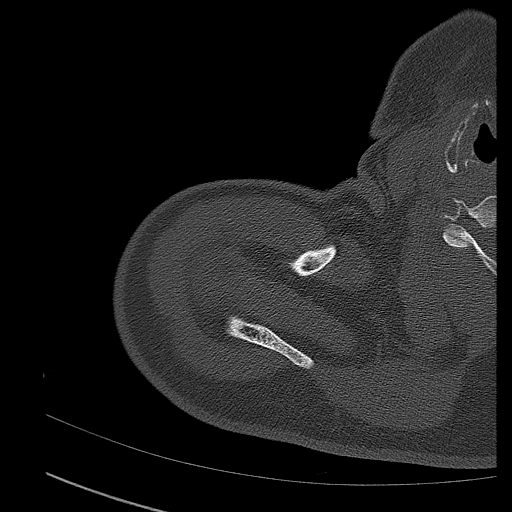

[Series 604: sagotta; · sagittal · 0.50mm/px · 5 of 102 slices shown, 6 images]
[im 34/102  bone]
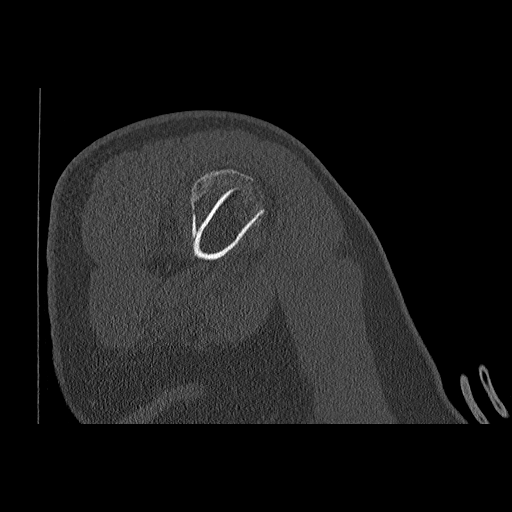
[im 43/102  bone]
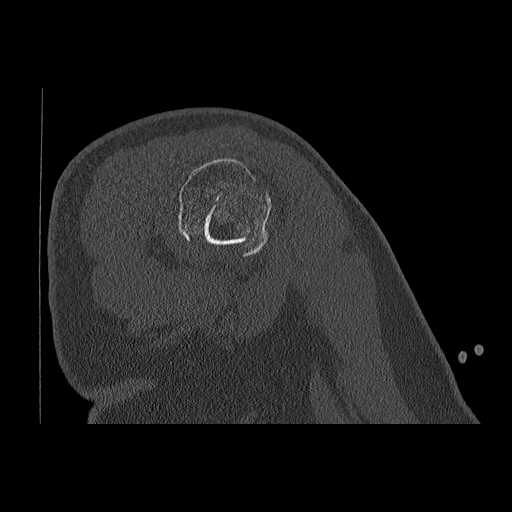
[im 51/102  soft-tissue]
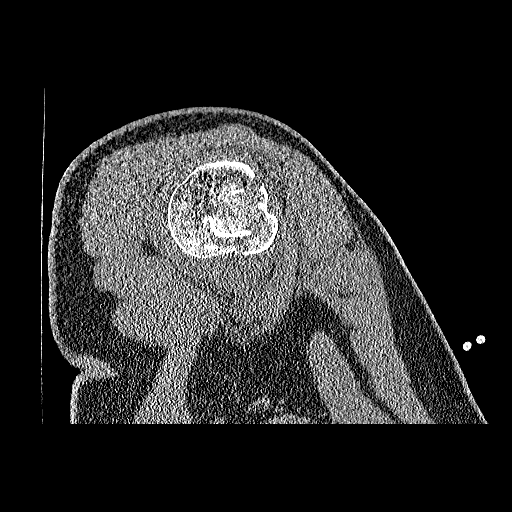
[im 51/102  bone]
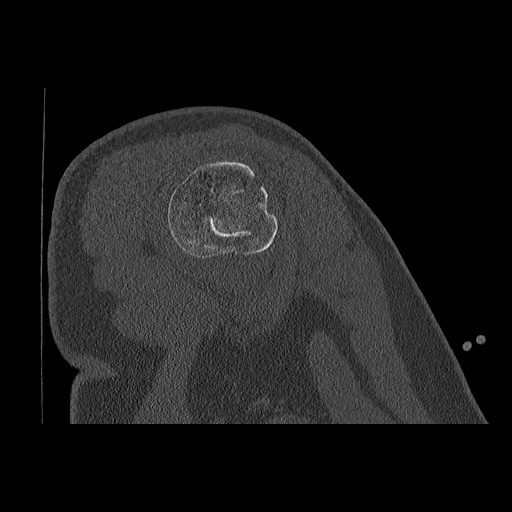
[im 59/102  bone]
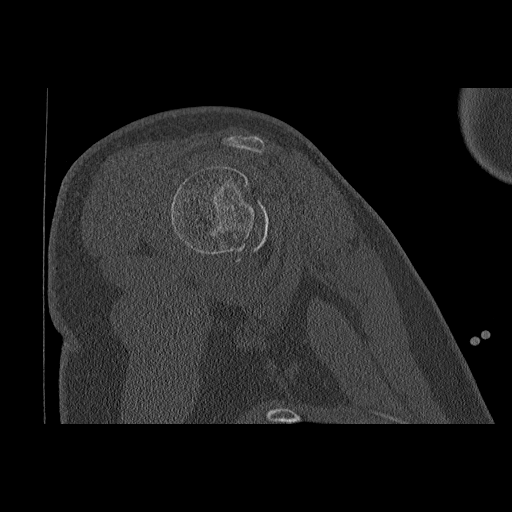
[im 68/102  bone]
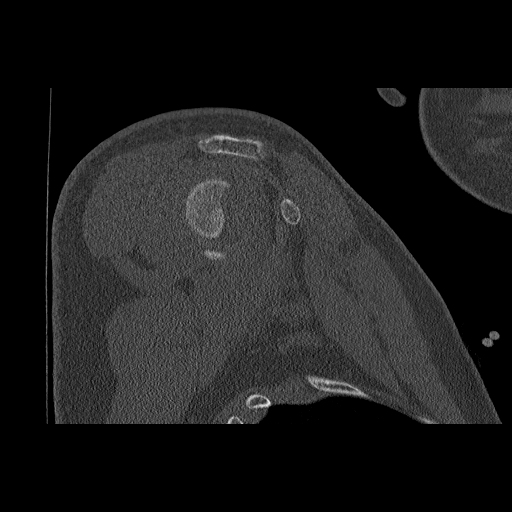

[17 of 33 positions shown; findings below may reference images not displayed]

FINDINGS: Comminuted fracture of the right humeral neck involves
both tuberosities and the articular surface of the humeral head
anteriorly. The humeral head articular surface is rotated
posteriorly with respect to the glenoid.  There is no dislocation.
The fracture is moderately impacted.  No intra-articular fracture
fragments are seen.

There is no evidence of scapular or clavicle fracture.  No rib
fractures are identified.  The visualized right lung is clear.
IMPRESSION: Comminuted fracture of the right humeral neck involves the
tuberosities and anterior aspect of the humeral head articular
surface.  No glenoid fracture or dislocation.

## 2013-04-21 DIAGNOSIS — E785 Hyperlipidemia, unspecified: Secondary | ICD-10-CM | POA: Insufficient documentation

## 2017-10-13 DIAGNOSIS — L83 Acanthosis nigricans: Secondary | ICD-10-CM | POA: Insufficient documentation

## 2020-02-29 ENCOUNTER — Encounter: Payer: Self-pay | Admitting: Podiatry

## 2020-02-29 ENCOUNTER — Ambulatory Visit (INDEPENDENT_AMBULATORY_CARE_PROVIDER_SITE_OTHER): Payer: BC Managed Care – PPO

## 2020-02-29 ENCOUNTER — Other Ambulatory Visit: Payer: Self-pay

## 2020-02-29 ENCOUNTER — Other Ambulatory Visit: Payer: Self-pay | Admitting: Podiatry

## 2020-02-29 ENCOUNTER — Ambulatory Visit: Payer: BC Managed Care – PPO | Admitting: Podiatry

## 2020-02-29 VITALS — BP 146/95 | HR 82 | Resp 16

## 2020-02-29 DIAGNOSIS — M79671 Pain in right foot: Secondary | ICD-10-CM | POA: Diagnosis not present

## 2020-02-29 DIAGNOSIS — T148XXA Other injury of unspecified body region, initial encounter: Secondary | ICD-10-CM | POA: Diagnosis not present

## 2020-02-29 DIAGNOSIS — M778 Other enthesopathies, not elsewhere classified: Secondary | ICD-10-CM

## 2020-02-29 NOTE — Progress Notes (Signed)
Subjective:  Patient ID: Anthony Warner, male    DOB: 1991/05/06,  MRN: [id number] HPI Chief Complaint  Patient presents with  . Foot Pain    Plantar/lateral side right - sudden, sharp pain x 2 days, no injury, noticed after getting fitted for new shoes, tried Ibuprofen    29 y.o. male presents with the above complaint.   ROS: Denies fever chills nausea vomiting muscle aches pains calf pain back pain chest pain shortness of breath.  Past Medical History:  Diagnosis Date  . Abrasion 09/14/2012   left lower leg  . Diabetes mellitus type 1 (HCC)    Insulin pump  . GERD (gastroesophageal reflux disease)    tums prn  . Proximal humerus fracture 09/13/2012   right; caused by hypoglycemic seizure/muscle contraction  . Seizures (HCC)    x 2 - due to hypoglycemia   Past Surgical History:  Procedure Laterality Date  . ORIF HUMERUS FRACTURE  09/15/2012   Procedure: OPEN REDUCTION INTERNAL FIXATION (ORIF) PROXIMAL HUMERUS FRACTURE;  Surgeon: Mable Paris, MD;  Location: [facility];  Service: Orthopedics;  Laterality: Right;  . WISDOM TOOTH EXTRACTION      Current Outpatient Medications:  .  insulin glargine (LANTUS) 100 UNIT/ML injection, In the event of a pump failure--Inject 55 units daily--, Disp: , Rfl:  .  Insulin Syringes, Disposable, U-100 0.5 ML MISC, Use to administer insulin in the case of insulin pump failure, Disp: , Rfl:  .  atorvastatin (LIPITOR) 20 MG tablet, Take 20 mg by mouth daily., Disp: , Rfl:  .  insulin aspart (NOVOLOG) 100 UNIT/ML injection, Inject into the skin 3 (three) times daily before meals. Insulin pump, Disp: , Rfl:  .  lisinopril (PRINIVIL,ZESTRIL) 5 MG tablet, Take 5 mg by mouth daily., Disp: , Rfl:   Allergies  Allergen Reactions  . Latex Other (See Comments)    SKIN IRRITATION   Review of Systems Objective:   Vitals:   02/29/20 0832  BP: (!) 146/95  Pulse: 82  Resp: 16    General: Well developed, nourished,  in no acute distress, alert and oriented x3   Dermatological: Skin is warm, dry and supple bilateral. Nails x 10 are well maintained; remaining integument appears unremarkable at this time. There are no open sores, no preulcerative lesions, no rash or signs of infection present.  Vascular: Dorsalis Pedis artery and Posterior Tibial artery pedal pulses are 2/4 bilateral with immedate capillary fill time. Pedal hair growth present. No varicosities and no lower extremity edema present bilateral.   Neruologic: Grossly intact via light touch bilateral. Vibratory intact via tuning fork bilateral. Protective threshold with Semmes Wienstein monofilament intact to all pedal sites bilateral. Patellar and Achilles deep tendon reflexes 2+ bilateral. No Babinski or clonus noted bilateral.   Musculoskeletal: No gross boney pedal deformities bilateral. No pain, crepitus, or limitation noted with foot and ankle range of motion bilateral. Muscular strength 5/5 in all groups tested bilateral.  He only has pain on palpation of the fifth metatarsal and its entire length.  There is mild hyperkeratotic tissue or thickening of the skin along the lateral aspect of the foot consistent with a cavus foot deformity which she does not have or tight fitting shoes.  His foot flattens out and he has a lateral gait.  His shoes however are semicurved last type shoe possibly resulting in some of the symptomatology.  Gait: Unassisted, Nonantalgic.    Radiographs:  Radiographs taken today do not demonstrate any type of  osseous abnormalities.  Osseously mature individual no acute findings.  Assessment & Plan:   Assessment: Bone contusion fifth metatarsal right foot  Plan: He will follow up with me should this not resolve over the next few weeks.  Should this worsen over the next couple of weeks he will come back in for another set of x-rays.     Lovett Coffin T. [place], [place]

## 2020-10-30 ENCOUNTER — Emergency Department (HOSPITAL_COMMUNITY): Payer: BC Managed Care – PPO

## 2020-10-30 ENCOUNTER — Encounter (HOSPITAL_COMMUNITY): Payer: Self-pay

## 2020-10-30 ENCOUNTER — Emergency Department (HOSPITAL_COMMUNITY)
Admission: EM | Admit: 2020-10-30 | Discharge: 2020-10-30 | Disposition: A | Discharge status: Home / Self Care | Payer: BC Managed Care – PPO | Attending: Emergency Medicine | Admitting: Emergency Medicine

## 2020-10-30 ENCOUNTER — Other Ambulatory Visit: Payer: Self-pay

## 2020-10-30 DIAGNOSIS — R110 Nausea: Secondary | ICD-10-CM | POA: Insufficient documentation

## 2020-10-30 DIAGNOSIS — Z79899 Other long term (current) drug therapy: Secondary | ICD-10-CM | POA: Diagnosis not present

## 2020-10-30 DIAGNOSIS — R079 Chest pain, unspecified: Secondary | ICD-10-CM

## 2020-10-30 DIAGNOSIS — E109 Type 1 diabetes mellitus without complications: Secondary | ICD-10-CM | POA: Insufficient documentation

## 2020-10-30 DIAGNOSIS — R0789 Other chest pain: Secondary | ICD-10-CM | POA: Insufficient documentation

## 2020-10-30 DIAGNOSIS — I10 Essential (primary) hypertension: Secondary | ICD-10-CM | POA: Insufficient documentation

## 2020-10-30 DIAGNOSIS — R030 Elevated blood-pressure reading, without diagnosis of hypertension: Secondary | ICD-10-CM | POA: Diagnosis not present

## 2020-10-30 DIAGNOSIS — Z794 Long term (current) use of insulin: Secondary | ICD-10-CM | POA: Insufficient documentation

## 2020-10-30 LAB — CBC
HCT: 45.8 % (ref 39.0–52.0)
Hemoglobin: 15.6 g/dL (ref 13.0–17.0)
MCH: 31.5 pg (ref 26.0–34.0)
MCHC: 34.1 g/dL (ref 30.0–36.0)
MCV: 92.3 fL (ref 80.0–100.0)
Platelets: 425 10*3/uL — ABNORMAL HIGH (ref 150–400)
RBC: 4.96 MIL/uL (ref 4.22–5.81)
RDW: 11.6 % (ref 11.5–15.5)
WBC: 8.8 10*3/uL (ref 4.0–10.5)
nRBC: 0 % (ref 0.0–0.2)

## 2020-10-30 LAB — TROPONIN I (HIGH SENSITIVITY)
Troponin I (High Sensitivity): 2 ng/L (ref <18)
Troponin I (High Sensitivity): 3 ng/L (ref <18)

## 2020-10-30 LAB — BASIC METABOLIC PANEL
Anion gap: 12 (ref 5–15)
BUN: 13 mg/dL (ref 6–20)
CO2: 26 mmol/L (ref 22–32)
Calcium: 9.6 mg/dL (ref 8.9–10.3)
Chloride: 101 mmol/L (ref 98–111)
Creatinine, Ser: 1 mg/dL (ref 0.61–1.24)
GFR, Estimated: >60 mL/min (ref >60)
Glucose, Bld: 116 mg/dL — ABNORMAL HIGH (ref 70–99)
Potassium: 3.7 mmol/L (ref 3.5–5.1)
Sodium: 139 mmol/L (ref 135–145)

## 2020-10-30 LAB — CBG MONITORING, ED: Glucose-Capillary: 103 mg/dL — ABNORMAL HIGH (ref 70–99)

## 2020-10-30 MED ORDER — ASPIRIN 81 MG PO CHEW
324.0000 mg | CHEWABLE_TABLET | Freq: Once | ORAL | Status: AC
  Administered 2020-10-30: 04:00:00 324 mg via ORAL
  Filled 2020-10-30: qty 4

## 2020-10-30 MED ORDER — NITROGLYCERIN 0.4 MG SL SUBL
0.4000 mg | SUBLINGUAL_TABLET | SUBLINGUAL | Status: DC | PRN
  Administered 2020-10-30: 04:00:00 0.4 mg via SUBLINGUAL
  Filled 2020-10-30: qty 1

## 2020-10-30 NOTE — ED Provider Notes (Signed)
[facility]-EMERGENCY DEPT Provider Note   CSN: [id number] Arrival date & time: 10/30/20  0309   History Chief Complaint  Patient presents with  . Chest Pain    Anthony Warner is a 29 y.o. male.  The history is provided by the patient.  Chest Pain He has history of hypertension, diabetes, hyperlipidemia and comes in because of tight feeling in his chest which started about 2 AM.  The discomfort is mild and he rates it a 2/10.  Nothing makes it better, nothing makes it worse.  There is no associated dyspnea or diaphoresis but there has been some mild nausea without vomiting.  He has not done anything to treat it.  He thinks his blood pressure has been high all day although he has not checked it.  He checks his blood pressure very infrequently, but states that it was normal when he saw his endocrinologist last week.  He thinks he may be having a panic attack.  Of note, he is a non-smoker but there is a family history of premature coronary atherosclerosis and that his grandmother had coronary artery disease starting in her 64s.  Past Medical History:  Diagnosis Date  . Abrasion 09/14/2012   left lower leg  . Diabetes mellitus type 1 (HCC)    Insulin pump  . GERD (gastroesophageal reflux disease)    tums prn  . High cholesterol   . Hypertension   . Proximal humerus fracture 09/13/2012   right; caused by hypoglycemic seizure/muscle contraction  . Seizures (HCC)    x 2 - due to hypoglycemia    Patient Active Problem List   Diagnosis Date Noted  . Acanthosis nigricans, acquired 10/13/2017  . Hyperlipidemia 04/21/2013  . Hypertension associated with diabetes (HCC) 04/21/2013  . Type 1 diabetes mellitus (HCC) 09/16/2012    Past Surgical History:  Procedure Laterality Date  . ORIF HUMERUS FRACTURE  09/15/2012   Procedure: OPEN REDUCTION INTERNAL FIXATION (ORIF) PROXIMAL HUMERUS FRACTURE;  Surgeon: Mable Paris, MD;  Location: [facility];  Service: Orthopedics;  Laterality: Right;  . WISDOM TOOTH EXTRACTION         No family history on file.  Social History   Tobacco Use  . Smoking status: Never Smoker  . Smokeless tobacco: Never Used  Substance Use Topics  . Alcohol use: Yes    Comment: 2 x/week  . Drug use: No    Home Medications Prior to Admission medications   Medication Sig Start Date End Date Taking? Authorizing Provider  atorvastatin (LIPITOR) 20 MG tablet Take 20 mg by mouth daily. 02/14/20   [provider]  insulin aspart (NOVOLOG) 100 UNIT/ML injection Inject into the skin 3 (three) times daily before meals. Insulin pump    [provider]  insulin glargine (LANTUS) 100 UNIT/ML injection In the event of a pump failure--Inject 55 units daily-- 01/05/19   [provider]  Insulin Syringes, Disposable, U-100 0.5 ML MISC Use to administer insulin in the case of insulin pump failure 10/13/18   [provider]  lisinopril (PRINIVIL,ZESTRIL) 5 MG tablet Take 5 mg by mouth daily.    [provider]    Allergies    Latex  Review of Systems   Review of Systems  Cardiovascular: Positive for chest pain.  All other systems reviewed and are negative.   Physical Exam Updated Vital Signs BP (!) 180/112 (BP Location: Right Arm)   Pulse (!) 101   Temp 98.2 F (36.8 C) (  Oral)   Resp 19   SpO2 99%   Physical Exam Vitals and nursing note reviewed.   29 year old male, resting comfortably and in no acute distress. Vital signs are significant for elevated blood pressure. Oxygen saturation is 99%, which is normal. Head is normocephalic and atraumatic. PERRLA, EOMI. Oropharynx is clear. Neck is nontender and supple without adenopathy or JVD. Back is nontender and there is no CVA tenderness. Lungs are clear without rales, wheezes, or rhonchi. Chest is nontender. Heart has regular rate and rhythm without murmur. Abdomen is soft, flat, nontender without masses  or hepatosplenomegaly and peristalsis is normoactive. Extremities have no cyanosis or edema, full range of motion is present. Skin is warm and dry without rash. Neurologic: Mental status is normal, cranial nerves are intact, there are no motor or sensory deficits.  ED Results / Procedures / Treatments   Labs (all labs ordered are listed, but only abnormal results are displayed) Labs Reviewed  BASIC METABOLIC PANEL - Abnormal; Notable for the following components:      Result Value   Glucose, Bld 116 (*)    All other components within normal limits  CBC - Abnormal; Notable for the following components:   Platelets 425 (*)    All other components within normal limits  CBG MONITORING, ED - Abnormal; Notable for the following components:   Glucose-Capillary 103 (*)    All other components within normal limits  TROPONIN I (HIGH SENSITIVITY)  TROPONIN I (HIGH SENSITIVITY)    EKG ECG shows normal sinus tachycardia with a rate of 104, no ectopy. Normal axis. Normal P wave. Normal QRS. Normal intervals. Normal ST and T waves. Impression: Sinus tachycardia, otherwise normal ECG.  When compared with ECG of 09/13/2012, no significant changes are seen.  Radiology DG Chest 2 View  Result Date: 10/30/2020 CLINICAL DATA:  Chest pain EXAM: CHEST - 2 VIEW COMPARISON:  None. FINDINGS: The heart size and mediastinal contours are within normal limits. Both lungs are clear. The visualized skeletal structures are unremarkable. IMPRESSION: Negative. Electronically Signed   By: Charlett Nose M.D.   On: 10/30/2020 03:45    Procedures Procedures   Medications Ordered in ED Medications - No data to display  ED Course  I have reviewed the triage vital signs and the nursing notes.  Pertinent labs & imaging results that were available during my care of the patient were reviewed by me and considered in my medical decision making (see chart for details).  MDM Rules/Calculators/A&P Chest pain of uncertain  cause.  However, description of pressure is concerning for possible cardiac etiology.  He does have significant risk factors for coronary disease but ECG shows no acute process.  Chest x-ray shows no acute process.  Initial troponin is normal.  Heart pathway risk score is 3 which puts patient at low risk for major adverse cardiac events in the next 6 weeks.  He is given aspirin and dose of nitroglycerin and states he is feeling much better.  Blood pressure has come down but is still mildly elevated.  Old records are reviewed, and he had a normal blood pressure reading at his endocrinologist's office 4 days ago.  Repeat troponin is normal, he is felt to be safe for discharge.  Given his risk factors, he will be referred to cardiology for outpatient evaluation.  Advised to monitor his blood pressure as an outpatient.  Final Clinical Impression(s) / ED Diagnoses Final diagnoses:  Nonspecific chest pain  Elevated blood pressure reading with  diagnosis of hypertension    Rx / DC Orders ED Discharge Orders    None       Dione Booze, MD 10/30/20 [phone]

## 2020-10-30 NOTE — ED Triage Notes (Signed)
Patient arrived stating that about an hour ago he started having left sided chest pain. Declines any shortness of breath but has some nausea. Reports feeling dizzy earlier today like he was going to pass out.

## 2020-10-30 NOTE — Discharge Instructions (Addendum)
Please check your blood pressure once or twice a day for the next week.  If it is staying consistently elevated, talk with your primary care provider about whether you need to adjust your medication.

## 2020-11-01 ENCOUNTER — Other Ambulatory Visit: Payer: Self-pay

## 2020-11-01 ENCOUNTER — Encounter: Payer: Self-pay | Admitting: Interventional Cardiology

## 2020-11-01 ENCOUNTER — Ambulatory Visit: Payer: BC Managed Care – PPO | Admitting: Interventional Cardiology

## 2020-11-01 VITALS — BP 140/88 | HR 90 | Ht 68.0 in | Wt 214.6 lb

## 2020-11-01 DIAGNOSIS — R072 Precordial pain: Secondary | ICD-10-CM

## 2020-11-01 DIAGNOSIS — I10 Essential (primary) hypertension: Secondary | ICD-10-CM

## 2020-11-01 DIAGNOSIS — E109 Type 1 diabetes mellitus without complications: Secondary | ICD-10-CM | POA: Diagnosis not present

## 2020-11-01 DIAGNOSIS — Z8249 Family history of ischemic heart disease and other diseases of the circulatory system: Secondary | ICD-10-CM

## 2020-11-01 MED ORDER — METOPROLOL TARTRATE 50 MG PO TABS: ORAL_TABLET | ORAL | 0 refills | Status: AC

## 2020-11-01 NOTE — Patient Instructions (Addendum)
Medication Instructions:  Your physician recommends that you continue on your current medications as directed. Please refer to the Current Medication list given to you today.  *If you need a refill on your cardiac medications before your next appointment, please call your pharmacy*   Lab Work: None today  Your physician recommends that you return for lab work prior to Cardiac CT for BMET  If you have labs (blood work) drawn today and your tests are completely normal, you will receive your results only by:  MyChart Message (if you have MyChart) OR  A paper copy in the mail If you have any lab test that is abnormal or we need to change your treatment, we will call you to review the results.   Testing/Procedures: Your physician has requested that you have cardiac CT.  Follow-Up: At [facility], you and your health needs are our priority.  As part of our continuing mission to provide you with exceptional heart care, we have created designated Provider Care Teams.  These Care Teams include your primary Cardiologist (physician) and Advanced Practice Providers (APPs -  Physician Assistants and Nurse Practitioners) who all work together to provide you with the care you need, when you need it.  We recommend signing up for the patient portal called "MyChart".  Sign up information is provided on this After Visit Summary.  MyChart is used to connect with patients for Virtual Visits (Telemedicine).  Patients are able to view lab/test results, encounter notes, upcoming appointments, etc.  Non-urgent messages can be sent to your provider as well.   To learn more about what you can do with MyChart, go to [email or web address].    Your next appointment:   12 month(s)  The format for your next appointment:   In Person  Provider:   You may see Casandra Doffing, MD or one of the following Advanced Practice Providers on your designated Care Team:    Melina Copa, PA-C  Ermalinda Barrios,  PA-C    Other Instructions Your cardiac CT will be scheduled at one of the below locations:   [facility] [address] [place], [place] [address] [phone]  [place] [address] [place], [place] [address] [phone]  If scheduled at [facility], please arrive at the [facility] main entrance of [facility] 30 minutes prior to test start time. Proceed to the [facility] Radiology Department (first floor) to check-in and test prep.  If scheduled at [facility], please arrive 15 mins early for check-in and test prep.  Please follow these instructions carefully (unless otherwise directed):  Hold all erectile dysfunction medications at least 3 days (72 hrs) prior to test.  On the Night Before the Test:  Be sure to Drink plenty of water.  Do not consume any caffeinated/decaffeinated beverages or chocolate 12 hours prior to your test.  Do not take any antihistamines 12 hours prior to your test.  DO NOT take your metformin the day before your procedure   On the Day of the Test:  Drink plenty of water. Do not drink any water within one hour of the test.  Do not eat any food 4 hours prior to the test.  You may take your regular medications prior to the test.   DO NOT take your metformin the day of the procedure  DO NOT use an extra insulin the morning of the procedure  Keep you insulin pump set at  your basal rate  Take metoprolol (Lopressor) 50 MG two hours prior to test.       After the Test:  Drink plenty of water.  After receiving IV contrast, you may experience a mild flushed feeling. This is normal.  On occasion, you may experience a mild rash up to 24 hours after the test. This is not dangerous. If this occurs, you can take Benadryl 25 mg and increase your fluid intake.  If you experience trouble breathing, this can be serious. If it is  severe call [phone] IMMEDIATELY. If it is mild, please call our office.  DO NOT take metformin for 48 hours after the procedure   Once we have confirmed authorization from your insurance company, we will call you to set up a date and time for your test. Based on how quickly your insurance processes prior authorizations requests, please allow up to 4 weeks to be contacted for scheduling your Cardiac CT appointment. Be advised that routine Cardiac CT appointments could be scheduled as many as 8 weeks after your provider has ordered it.  For non-scheduling related questions, please contact the cardiac imaging nurse navigator should you have any questions/concerns: Marchia Bond, Cardiac Imaging Nurse Navigator Burley Saver, Interim Cardiac Imaging Nurse [place] [facility] Direct Office Dial: [phone]   For scheduling needs, including cancellations and rescheduling, please call [place], [phone].

## 2020-11-01 NOTE — Progress Notes (Signed)
Cardiology Office Note   Date:  11/01/2020   ID:  Anthony Warner, DOB [birth year], MRN [id number]  PCP:  Anthony Hawks, PA-C    No chief complaint on file.  Chest pain  Wt Readings from Last 3 Encounters:  11/01/20 214 lb 9.6 oz (97.3 kg)  10/30/20 218 lb (98.9 kg)  09/15/12 187 lb 3.2 oz (84.9 kg)       History of Present Illness: Anthony Warner is a 29 y.o. male  With type 1 DM.  He had some CP on 10/30/20.  He was diagnosed at age 89 with DM.    He was playing oboe at [facility] and was dizzy.  He stopped for a while and then finished, and went home.  BP was high and HR was > 100.  Ultimately went to ER after no resolution of sx.    ER Records show:  "He has history of hypertension, diabetes, hyperlipidemia and comes in because of tight feeling in his chest which started about 2 AM.  The discomfort is mild and he rates it a 2/10.  Nothing makes it better, nothing makes it worse.  There is no associated dyspnea or diaphoresis but there has been some mild nausea without vomiting.  He has not done anything to treat it.  He thinks his blood pressure has been high all day although he has not checked it.  He checks his blood pressure very infrequently, but states that it was normal when he saw his endocrinologist last week.  He thinks he may be having a panic attack.  Of note, he is a non-smoker but there is a family history of premature coronary atherosclerosis and that his grandmother had coronary artery disease starting in her 36s."  Negative cardiac w/u when he went to ER.  Discharged for OP f/u.    maternal GM with heart surgery at age 38, followed by stents 20 years later at age 71.  No smoking.  Walks 1.25 mile daily.  Feels fine.  No sx lie what he had day of ER visit.       Past Medical History:  Diagnosis Date  . Abrasion 09/14/2012   left lower leg  . Diabetes mellitus type 1 (HCC)    Insulin pump  . GERD (gastroesophageal reflux disease)    tums prn  .  High cholesterol   . Hypertension   . Proximal humerus fracture 09/13/2012   right; caused by hypoglycemic seizure/muscle contraction  . Seizures (HCC)    x 2 - due to hypoglycemia    Past Surgical History:  Procedure Laterality Date  . ORIF HUMERUS FRACTURE  09/15/2012   Procedure: OPEN REDUCTION INTERNAL FIXATION (ORIF) PROXIMAL HUMERUS FRACTURE;  Surgeon: Mable Paris, MD;  Location: [facility];  Service: Orthopedics;  Laterality: Right;  . WISDOM TOOTH EXTRACTION       Current Outpatient Medications  Medication Sig Dispense Refill  . atorvastatin (LIPITOR) 20 MG tablet Take 20 mg by mouth daily.    . insulin aspart (NOVOLOG) 100 UNIT/ML injection Inject into the skin 3 (three) times daily before meals. Insulin pump    . insulin glargine (LANTUS) 100 UNIT/ML injection In the event of a pump failure--Inject 55 units daily--    . Insulin Syringes, Disposable, U-100 0.5 ML MISC Use to administer insulin in the case of insulin pump failure    . lisinopril (ZESTRIL) 10 MG tablet Take 10 mg by mouth daily.    . metFORMIN (GLUCOPHAGE-XR)  500 MG 24 hr tablet Take 1 tablet by mouth daily.    . metoprolol tartrate (LOPRESSOR) 50 MG tablet Take 1 tablet by mouth 2 hours prior to Cardiac CT 1 tablet 0   No current facility-administered medications for this visit.    Allergies:   Latex    Social History:  The patient  reports that he has never smoked. He has never used smokeless tobacco. He reports current alcohol use. He reports that he does not use drugs.   Family History:  The patient's family history includes Heart disease in his maternal grandmother.    ROS:  Please see the history of present illness.   Otherwise, review of systems are positive for anxiety.   All other systems are reviewed and negative.    PHYSICAL EXAM: VS:  BP 140/88   Pulse 90   Ht 5\' 8"  (1.727 m)   Wt 214 lb 9.6 oz (97.3 kg)   SpO2 98%   BMI 32.63 kg/m  , BMI Body mass index is  32.63 kg/m. GEN: Well nourished, well developed, in no acute distress  HEENT: normal  Neck: no JVD, carotid bruits, or masses Cardiac: RRR; no murmurs, rubs, or gallops,no edema  Respiratory:  clear to auscultation bilaterally, normal work of breathing GI: soft, nontender, nondistended, + BS MS: no deformity or atrophy  Skin: warm and dry, no rash Neuro:  Strength and sensation are intact Psych: euthymic mood, full affect   EKG:   The ekg ordered 10/30/20 demonstrates sinus tach, no ST changes   Recent Labs: 10/30/2020: BUN 13; Creatinine, Ser 1.00; Hemoglobin 15.6; Platelets 425; Potassium 3.7; Sodium 139   Lipid Panel No results found for: CHOL, TRIG, HDL, CHOLHDL, VLDL, LDLCALC, LDLDIRECT   Other studies Reviewed: Additional studies/ records that were reviewed today with results demonstrating: Hospital records reviewed.   ASSESSMENT AND PLAN:  1. Chest pain : Some typical features.  Some atypical features.  Risk factors for heart disease include family history and type 1 diabetes for nearly 20 years.  We will plan for CTA coronaries.  If he does not fact have coronary calcium, will have to intensify his lipid-lowering therapy.  Metoprolol 50 mg x 1 prior to the coronary CTA. 2. DM1: Well controlled.  Following with endocrinology. 3. Family history of CAD: Whole food, plant-based diet recommended. 4. Hypertension: Continue ACE inhibitor.   Current medicines are reviewed at length with the patient today.  The patient concerns regarding his medicines were addressed.  The following changes have been made:  No change  Labs/ tests ordered today include:   Orders Placed This Encounter  Procedures  . CT CORONARY MORPH W/CTA COR W/SCORE W/CA W/CM &/OR WO/CM  . CT CORONARY FRACTIONAL FLOW RESERVE DATA PREP  . CT CORONARY FRACTIONAL FLOW RESERVE FLUID ANALYSIS  . Basic metabolic panel    Recommend 150 minutes/week of aerobic exercise Low fat, low carb, high fiber diet  recommended  Disposition:   FU in 1 year, and for CTA   Signed, 11/01/2020, MD  11/01/2020 3:45 PM    [facility] [address] [place], [place], [place]  [place] Phone: [phone]; Fax: [phone]

## 2021-06-05 IMAGING — CR DG CHEST 2V
2 series · 2 of 2 positions shown · non-contrast
Comparison: None.

CLINICAL DATA: Chest pain

EXAM:
CHEST - 2 VIEW

[w chest pa]
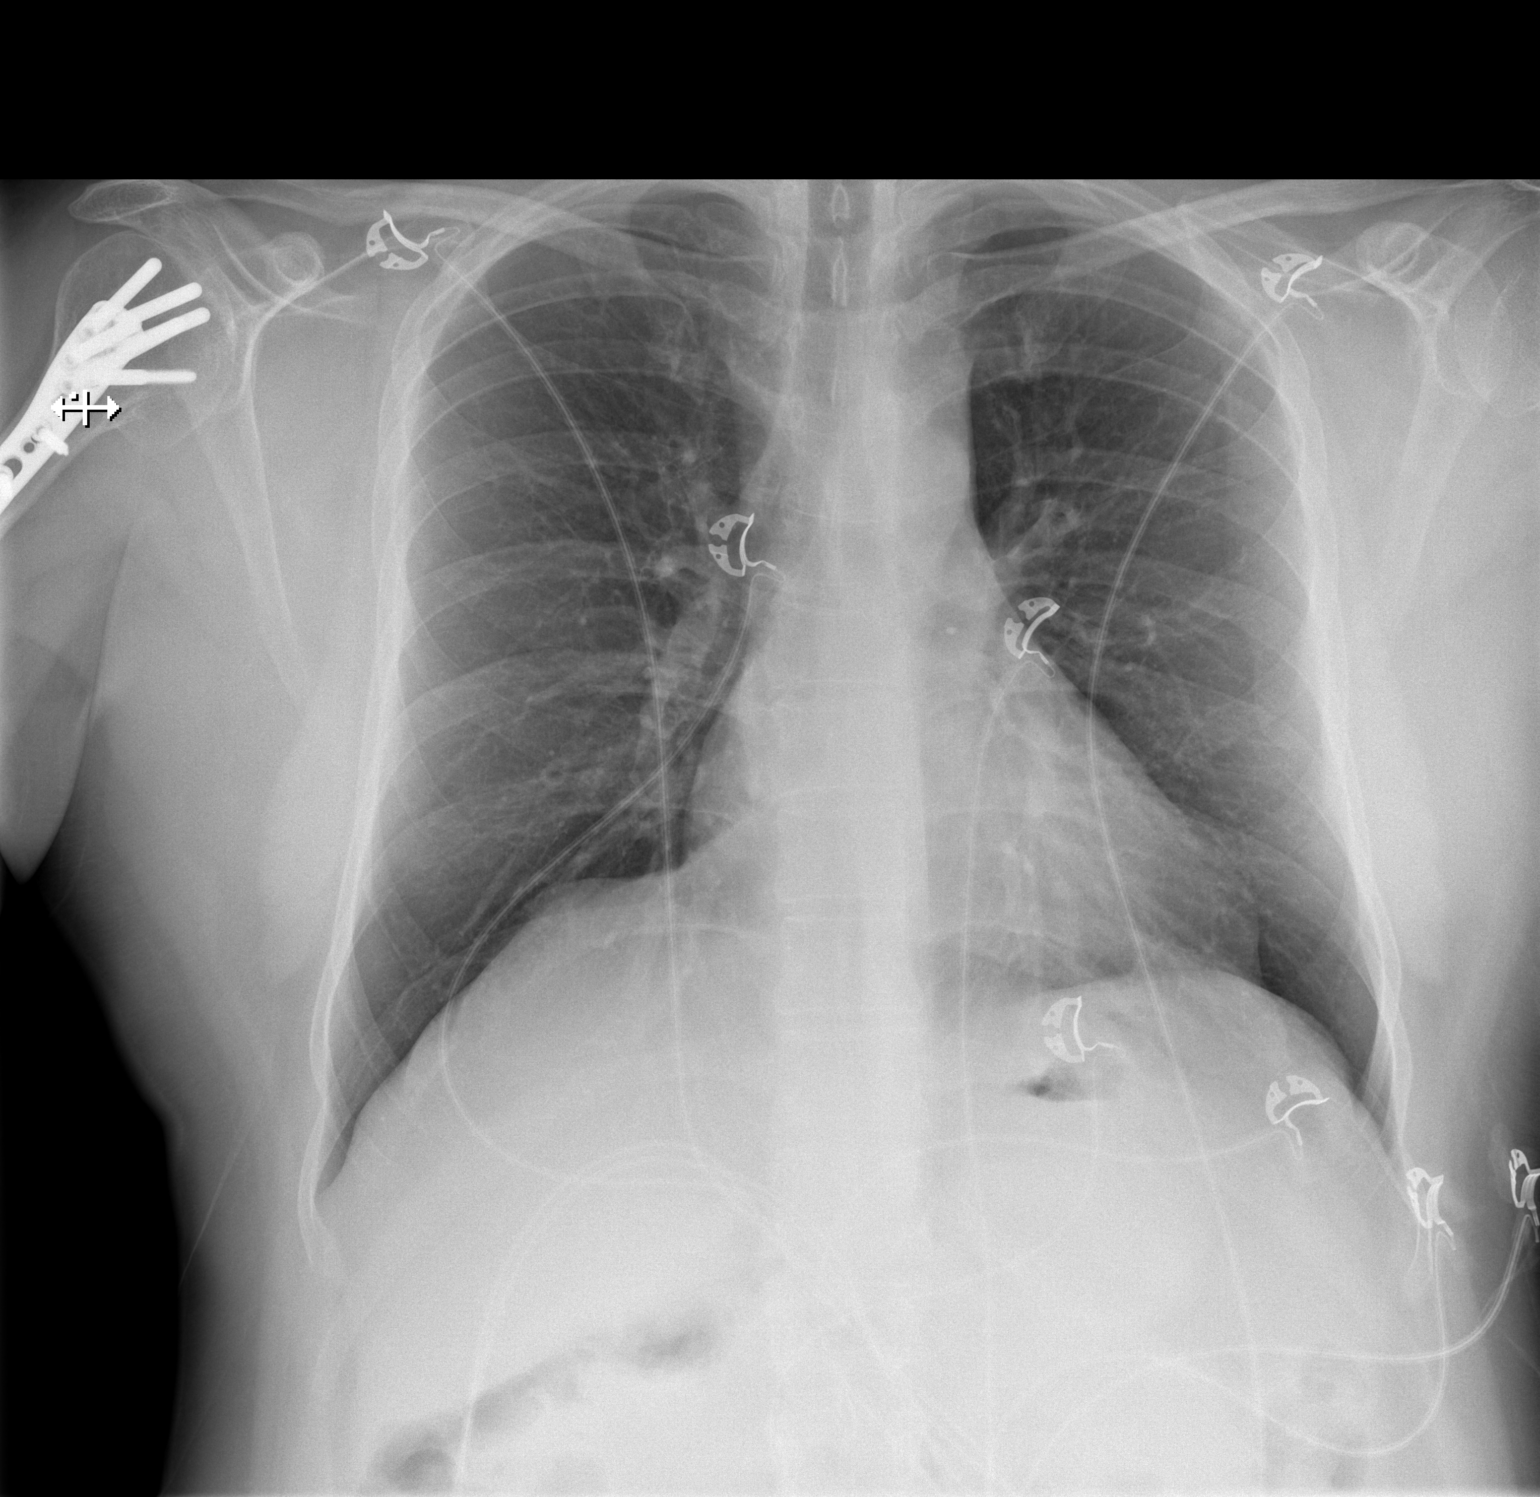

[w chest lat]
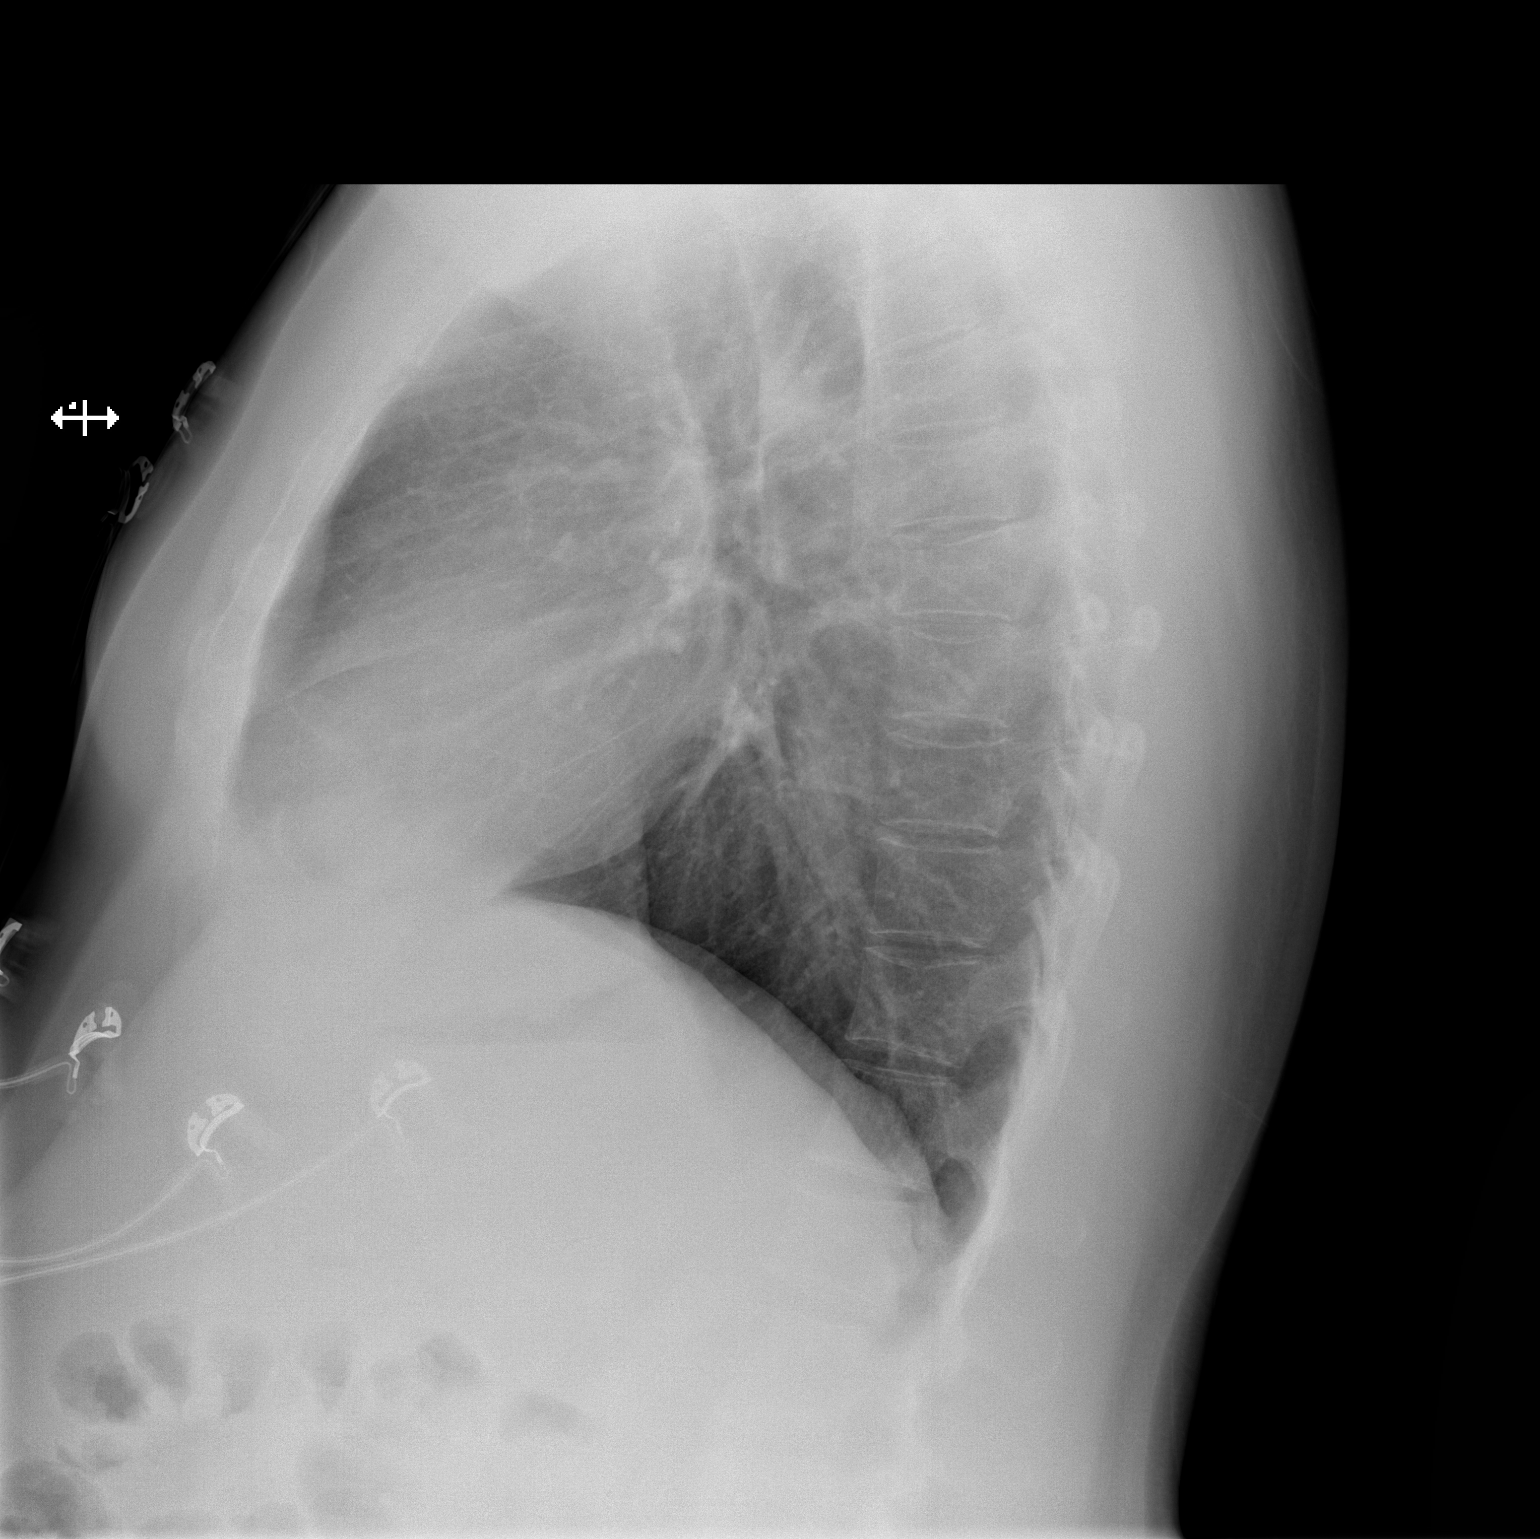

[2 of 2 positions shown; findings below may reference images not displayed]

FINDINGS: The heart size and mediastinal contours are within normal limits.
Both lungs are clear. The visualized skeletal structures are
unremarkable.
IMPRESSION: Negative.
